# Patient Record
Sex: Male | Born: 1935 | Race: White | Hispanic: No | Marital: Married | Smoking: Former smoker
Health system: Southern US, Community
[De-identification: ages and names within clinical notes are randomized; demographics above are authoritative.]

## PROBLEM LIST (undated history)

## (undated) DIAGNOSIS — N329 Bladder disorder, unspecified: Secondary | ICD-10-CM

## (undated) DIAGNOSIS — E785 Hyperlipidemia, unspecified: Secondary | ICD-10-CM

## (undated) DIAGNOSIS — I10 Essential (primary) hypertension: Secondary | ICD-10-CM

## (undated) DIAGNOSIS — Z9181 History of falling: Secondary | ICD-10-CM

## (undated) DIAGNOSIS — Z8551 Personal history of malignant neoplasm of bladder: Secondary | ICD-10-CM

## (undated) DIAGNOSIS — G3183 Dementia with Lewy bodies: Secondary | ICD-10-CM

## (undated) DIAGNOSIS — G20 Parkinson's disease: Secondary | ICD-10-CM

## (undated) DIAGNOSIS — F0280 Dementia in other diseases classified elsewhere without behavioral disturbance: Secondary | ICD-10-CM

## (undated) DIAGNOSIS — E119 Type 2 diabetes mellitus without complications: Secondary | ICD-10-CM

## (undated) DIAGNOSIS — N39498 Other specified urinary incontinence: Secondary | ICD-10-CM

## (undated) DIAGNOSIS — M1990 Unspecified osteoarthritis, unspecified site: Secondary | ICD-10-CM

## (undated) DIAGNOSIS — G20C Parkinsonism, unspecified: Secondary | ICD-10-CM

## (undated) DIAGNOSIS — J00 Acute nasopharyngitis [common cold]: Secondary | ICD-10-CM

## (undated) DIAGNOSIS — Z993 Dependence on wheelchair: Secondary | ICD-10-CM

## (undated) DIAGNOSIS — L740 Miliaria rubra: Secondary | ICD-10-CM

## (undated) HISTORY — PX: CARDIOVASCULAR STRESS TEST: SHX262

## (undated) HISTORY — DX: Unspecified osteoarthritis, unspecified site: M19.90

## (undated) HISTORY — PX: TRANSURETHRAL RESECTION OF BLADDER TUMOR: SHX2575

---

## 2011-07-25 HISTORY — PX: CATARACT EXTRACTION W/ INTRAOCULAR LENS  IMPLANT, BILATERAL: SHX1307

## 2015-02-16 ENCOUNTER — Telehealth: Payer: Self-pay | Admitting: *Deleted

## 2015-02-16 ENCOUNTER — Encounter: Payer: Self-pay | Admitting: Neurology

## 2015-02-16 ENCOUNTER — Ambulatory Visit (INDEPENDENT_AMBULATORY_CARE_PROVIDER_SITE_OTHER): Payer: Medicare PPO | Admitting: Neurology

## 2015-02-16 VITALS — BP 124/73 | HR 71 | Temp 98.2°F | Ht 70.0 in | Wt 188.6 lb

## 2015-02-16 DIAGNOSIS — F0280 Dementia in other diseases classified elsewhere without behavioral disturbance: Secondary | ICD-10-CM | POA: Diagnosis not present

## 2015-02-16 DIAGNOSIS — J449 Chronic obstructive pulmonary disease, unspecified: Secondary | ICD-10-CM

## 2015-02-16 DIAGNOSIS — E785 Hyperlipidemia, unspecified: Secondary | ICD-10-CM | POA: Diagnosis not present

## 2015-02-16 DIAGNOSIS — G3183 Dementia with Lewy bodies: Secondary | ICD-10-CM | POA: Diagnosis not present

## 2015-02-16 DIAGNOSIS — E119 Type 2 diabetes mellitus without complications: Secondary | ICD-10-CM | POA: Diagnosis not present

## 2015-02-16 DIAGNOSIS — E038 Other specified hypothyroidism: Secondary | ICD-10-CM | POA: Diagnosis not present

## 2015-02-16 DIAGNOSIS — G20 Parkinson's disease: Secondary | ICD-10-CM

## 2015-02-16 MED ORDER — DONEPEZIL HCL 10 MG PO TABS: 10.0000 mg | ORAL_TABLET | Freq: Every day | ORAL | Status: DC

## 2015-02-16 MED ORDER — PRAMIPEXOLE DIHYDROCHLORIDE 0.75 MG PO TABS: 0.7500 mg | ORAL_TABLET | Freq: Three times a day (TID) | ORAL | Status: DC

## 2015-02-16 NOTE — Progress Notes (Addendum)
[facility]    Provider:  Dr Jaynee Eagles Referring Provider: Curlene Labrum, MD Primary Care Physician:  Curlene Labrum, MD  CC: Lewy Body Dementia  HPI:  Frederick Lawrence is a 81 y.o. male here as a referral from Dr. Pleas Koch for Lewy Body Dementia. PMHx diabetes and HLD, acute hypoxic respiratory failure, pneumonia, COPD, hypothroidism, DM.   He was diagnosed with Lewy Body dementia 2 years. They ignored the symptoms for a while. He began to stumble. They went to Dr. Linton Rump. He was told he has a neurologic disorder with some parkinsons like symptoms. He takes Mirapex and Carbidopa/levodopa from an outside physician. Doesn't feel like it has helped. They initially noticed he didn't know what the day was and didn't care. He knows his birthdate. He had  troubling dreams and hasn't happened for some time, .he has vivid dreams but doesn't act them out. His voice is getting softer. No drooling or wet pillows. He is incontinent. No constipation. His is slowing down, shuffling. Can't pick the feet up. No loss of smell or taste. Loves to eat. He noticed the motor skills more than the memory first. In the last 6 months, he doesn't want to communicate. He will answer questions but rarely speaks spontaneously. Handwriting is getting smaller. No difficukly swallowing currently. Not lightheaded. He does not feel the medication kicking in. He "runs down" in the afternoon before the last dose. She feels he is more stable now, not getting worse. He had pneumonia and getting better from that. H eis in physical therapy. Worse when eating breakfast. Right hand tremor mostly at rest and when eating. Having freezing episodes. Will take the medication at 8am, then at 1 and then at 8pm. He has been on the carbidopa and mirapex doses for at least 2 years without increase.   Reviewed notes, labs and imaging from outside physicians, which showed: recently moved from [facility] to [place] to be closer to family. Was seein  neurology for Lewy Body Dementia. He is on Carbidopa/Levodopa ER 50mg /200mg  ER tablet TID. He is on Aricept 5mg .   Review of Systems: Patient complains of symptoms per HPI as well as the following symptoms: easy bruising, hearing loss, urination problems, incontinence, memory loss, confusion, tremor, depression, too much sleep, disinterets in activities. . Pertinent negatives per HPI. All others negative.   History   Social History  . Marital Status: Married    Spouse Name: Francesca Jewett   . Number of Children: 3  . Years of Education: N/A   Occupational History  . Not on file.   Social History Main Topics  . Smoking status: Former Smoker -- 1.00 packs/day    Types: Cigarettes    Quit date: 01/10/2015  . Smokeless tobacco: Not on file  . Alcohol Use: 0.0 oz/week    0 Standard drinks or equivalent per week     Comment: Rarely   . Drug Use: No  . Sexual Activity: Not on file   Other Topics Concern  . Not on file   Social History Narrative   Lives at home with wife, Francesca Jewett   Caffeine use: 1 cup coffee/morning     Family History  Problem Relation Age of Onset  . Heart failure Mother   . Heart failure Father     Past Medical History  Diagnosis Date  . Diabetes   . Hyperlipemia     Past Surgical History  Procedure Laterality Date  . Bladder surgery  March 2016    Removal of  malignant neoplasm    Current Outpatient Prescriptions  Medication Sig Dispense Refill  . carbidopa-levodopa (SINEMET CR) 50-200 MG per tablet Take 1 tablet by mouth 3 (three) times daily.  0  . citalopram (CELEXA) 40 MG tablet Take 40 mg by mouth daily.  1  . donepezil (ARICEPT) 10 MG tablet Take 1 tablet (10 mg total) by mouth at bedtime. 30 tablet 6  . glipiZIDE (GLUCOTROL) 5 MG tablet Take 5 mg by mouth daily before breakfast.    . lisinopril (PRINIVIL,ZESTRIL) 20 MG tablet Take 20 mg by mouth daily.    . metFORMIN (GLUCOPHAGE) 1000 MG tablet Take 1,000 mg by mouth 2 (two) times daily with a  meal.    . pramipexole (MIRAPEX) 0.75 MG tablet Take 1 tablet (0.75 mg total) by mouth 3 (three) times daily. 90 tablet 6  . pravastatin (PRAVACHOL) 20 MG tablet Take 20 mg by mouth daily.    . tamsulosin (FLOMAX) 0.4 MG CAPS capsule Take 0.4 mg by mouth 2 (two) times daily.    . traZODone (DESYREL) 50 MG tablet Take 50 mg by mouth as needed.  2   No current facility-administered medications for this visit.    Allergies as of 02/16/2015  . (No Known Allergies)    Vitals: BP 124/73 mmHg  Pulse 71  Temp(Src) 98.2 F (36.8 C) (Oral)  Ht 5\' 10"  (1.778 m)  Wt 188 lb 9.6 oz (85.548 kg)  BMI 27.06 kg/m2 Last Weight:  Wt Readings from Last 1 Encounters:  02/16/15 188 lb 9.6 oz (85.548 kg)   Last Height:   Ht Readings from Last 1 Encounters:  02/16/15 5\' 10"  (1.778 m)    Physical exam: Exam: Gen: NAD, not conversant, well nourised, well groomed                     CV: RRR, no MRG. No Carotid Bruits. No peripheral edema, warm, nontender Eyes: Conjunctivae clear without exudates or hemorrhage  Neuro: Detailed Neurologic Exam  Speech:    Speech is without aphasia, dysarthria  Cognition:    The patient is oriented to person    recent and remote memory impaired;     language fluent;     Impaired attention, concentration,     fund of knowledge impaired Cranial Nerves:    The pupils are equal, round, and reactive to light. Attempted fundoscopic exam, unable to visualize.  Visual fields are full to finger confrontation. Extraocular movements are intact. Trigeminal sensation is intact and the muscles of mastication are normal. The face is symmetric. The palate elevates in the midline. Hearing intact. Voice is normal. Shoulder shrug is normal. The tongue has normal motion without fasciculations.   Coordination:    Bradykinetic finger taps   Gait: shuffling      Motor Observation:    Mil right resting tremor Tone:    Right cogwheeling > left arm cogwheeling   Posture:     Posture is normal. normal erect    Strength:    Strength is V/V in the upper and lower limbs.      Sensation: intact to LT     Reflex Exam:  DTR's:    Deep tendon reflexes in the upper and lower extremities are normal bilaterally.   Toes:    The toes are downgoing bilaterally.   Clonus:    Clonus is absent.      Assessment/Plan:   81 y.o. male here as a referral from Dr. Pleas Koch for Lewy Body  Dementia. PMHx diabetes and HLD, acute hypoxic respiratory failure, pneumonia, COPD, hypothroidism, DM. He was diagnosed years ago with lewy Body Dementia.   - Will request records - he is on Carbidopa/Levodopa 50/200 ER tid - unsure why he is on the ER instead of IR. Will hold for now but need records. - He is on Mirapex. Increase the Mirapex 0.75mg  three times a day Increase the Aricept to 10mg  daily  Discussed side effects including nausea, decreased inhibitions and increased risk for compulsive behavior, hypotention. Fall risk.  Will return in 3 months for follow up after I review records. Will likely make modifications to his medications.  Addendum: Notes from [facility].   MRI of the lumbar spine performed in February 2013: Shows mild to moderate lumbar spondylosis, borderline to mild central stenosis at L3-L4, multilevel facet arthropathy, small central disc protrusion at L1-L2.  EMG nerve conduction study in February 2013: Summary mild sensory greater than motor neuropathy. Needle EMG of the left lower extremity suggests chronic left L5 greater than S1 and L4 radiculopathy.  Labs include normal lead, normal methylmalonic acid, normal arsenic normal mercury normal cadmium, Z30 865, normal folic acid.  he was first evaluated in January 2013 for stooping over and losing balance was started in the summer of 2012. He described aching in the back, stooping, shuffling at the feet at time. Occasionally some trembling in the hands. Falls over forwards. Worsening. Gradual  onset and progressive. No incontinence. Some memory changes. Emotional instability and cries and laughs easily. Exam was significant for decreased sensation distally in the feet, slow shuffling gait which is slightly wide based. Diagnosed with diabetic neuropathy and symptoms of lumbar stenosis, symptoms of pseudobulbar affect, Lewy body disease. Had a long discussion with family in April 2013 with diagnosis of Lewy body disease. Aricept was started. Sinemet was started 25-100 by mouth twice a day. At a later appointment Sinemet was increased and low-dose Celexa was also added. At a later appointment Sinemet extended release 50-200 was substituted for Sinemet instant release, 1 by mouth twice a day. (Unclear why this was changed). Next appointment in October 2014 his Sinemet was changed to CR 50-200 milligram tablet one by mouth 3 times a day. In December 2014 Mirapex was added and subsequently increase at next appointments. Lenox Ponds was started in July 2015 while patient was still on Aricept 5 mg which doesn't appears that was ever increased to 10. Namenda was stopped because per notes neurologist did not feel as though it helped.   EEG in February 2013 was normal awake and drowsy.     Sarina Ill, MD  [facility] [address] [place], [place] [address]  Phone [phone] Fax [phone]

## 2015-02-16 NOTE — Patient Instructions (Addendum)
Overall you are doing fairly well but I do want to suggest a few things today:   Remember to drink plenty of fluid, eat healthy meals and do not skip any meals. Try to eat protein with a every meal and eat a healthy snack such as fruit or nuts in between meals. Try to keep a regular sleep-wake schedule and try to exercise daily, particularly in the form of walking, 20-30 minutes a day, if you can.   As far as your medications are concerned, I would like to suggest: Increase the Mirapex 0.75mg  three times a day Increase the Aricept to 10mg  daily  As far as diagnostic testing: Will review records  I would like to see you back in 3 months, sooner if we need to. Please call us with any interim questions, concerns, problems, updates or refill requests.   Please also call us for any test results so we can go over those with you on the phone.  My clinical assistant and will answer any of your questions and relay your messages to me and also relay most of my messages to you.   Our phone number is [phone]. We also have an after hours call service for urgent matters and there is a physician on-call for urgent questions. For any emergencies you know to call 911 or go to the nearest emergency room

## 2015-02-16 NOTE — Telephone Encounter (Signed)
Release faxed to Dr. Alain Marion requesting records.

## 2015-02-22 ENCOUNTER — Encounter: Payer: Self-pay | Admitting: Neurology

## 2015-02-22 DIAGNOSIS — E785 Hyperlipidemia, unspecified: Secondary | ICD-10-CM | POA: Insufficient documentation

## 2015-02-22 DIAGNOSIS — G3183 Dementia with Lewy bodies: Secondary | ICD-10-CM

## 2015-02-22 DIAGNOSIS — J449 Chronic obstructive pulmonary disease, unspecified: Secondary | ICD-10-CM | POA: Insufficient documentation

## 2015-02-22 DIAGNOSIS — G20 Parkinson's disease: Secondary | ICD-10-CM | POA: Insufficient documentation

## 2015-02-22 DIAGNOSIS — F0280 Dementia in other diseases classified elsewhere without behavioral disturbance: Secondary | ICD-10-CM | POA: Insufficient documentation

## 2015-02-22 DIAGNOSIS — E119 Type 2 diabetes mellitus without complications: Secondary | ICD-10-CM | POA: Insufficient documentation

## 2015-02-22 DIAGNOSIS — E039 Hypothyroidism, unspecified: Secondary | ICD-10-CM | POA: Insufficient documentation

## 2015-03-05 ENCOUNTER — Telehealth: Payer: Self-pay | Admitting: *Deleted

## 2015-03-05 NOTE — Telephone Encounter (Signed)
Received records from Dr Alain Marion went to [facility] and Dr Jaynee Eagles 03/05/15.

## 2015-03-11 ENCOUNTER — Telehealth: Payer: Self-pay | Admitting: Neurology

## 2015-03-11 ENCOUNTER — Other Ambulatory Visit: Payer: Self-pay | Admitting: Neurology

## 2015-03-11 DIAGNOSIS — G20 Parkinson's disease: Secondary | ICD-10-CM

## 2015-03-11 MED ORDER — PRAMIPEXOLE DIHYDROCHLORIDE 1 MG PO TABS: 1.0000 mg | ORAL_TABLET | Freq: Three times a day (TID) | ORAL | Status: DC

## 2015-03-11 NOTE — Telephone Encounter (Signed)
Yesterday he felt very bad. For 2-3 days he didn't feel like doing anything. He had yeast in his urine and was treated. That is when he went "down hill" and today he feels much better. He was running a slight temperature on Friday. Sounds like he had a medication reaction or some other metabolic or infectious process that caused his declne and not his parkinson's disease. He is going to pcp tomorrow for blood work.   Frederick Lawrence - can you call and make an appointment in the next 2 weeks for 30 minutes so we can discuss more medication management.   Frederick Lawrence - Patient would like MRI completed at [facility] please. I forgot to put that in the order. Sorry, thanks.

## 2015-03-11 NOTE — Telephone Encounter (Signed)
Pt's wife would like a call back regarding her husband, the pt. States that he has become weaker and lethargic. Has a hard time to get him to talk and answer anything. She has concern about medication carbidopa-levodopa (SINEMET CR) 50-200 MG per tablet. States that this medication has never been changed. Please call and advise [phone]

## 2015-03-11 NOTE — Telephone Encounter (Signed)
Frederick Lawrence witn [facility] called stating patient has declined, more difficulty with transition and initiating movement. Wife reports his carbidopa-levodopa (SINEMET CR) 50-200 MG per tablet has not been changed in about 3 years. She can be reached at [phone].

## 2015-03-15 ENCOUNTER — Telehealth: Payer: Self-pay | Admitting: Neurology

## 2015-03-15 MED ORDER — CARBIDOPA-LEVODOPA ER 50-200 MG PO TBCR: 1.0000 | EXTENDED_RELEASE_TABLET | Freq: Three times a day (TID) | ORAL | Status: DC

## 2015-03-15 NOTE — Telephone Encounter (Signed)
No problem! Rx has been sent to last until appt.  Receipt confirmed by pharmacy.  Frederick Lawrence is aware.

## 2015-03-15 NOTE — Telephone Encounter (Signed)
Called and spoke with pt/wife and scheduled f/u for 03/31/15 at 1:15pm for check-in at 1:00pm per Dr. Jaynee Eagles request. They verbalized understanding.

## 2015-03-15 NOTE — Telephone Encounter (Signed)
That was prescribed by another physician. I will approve refill it at this time but will likely make changes when he is here for his appointment. Woul dyou take care of it Jesscia please? Thank you!

## 2015-03-15 NOTE — Telephone Encounter (Signed)
I called back and spoke with Ms Dillion.  She verified patient is taking Carb/Levo ER 50-200mg  one three times daily.

## 2015-03-15 NOTE — Telephone Encounter (Signed)
Rosemary called requesting refill on carbidopa-levodopa (SINEMET CR) 50-200 MG per tablet, states he only has 1 left and won't have medication to take this evening. Pharmacy is [facility] in [place] on [address]. [phone].

## 2015-03-24 ENCOUNTER — Ambulatory Visit (HOSPITAL_COMMUNITY)
Admission: RE | Admit: 2015-03-24 | Discharge: 2015-03-24 | Disposition: A | Discharge status: Home / Self Care | Payer: Medicare PPO | Source: Ambulatory Visit | Attending: Neurology | Admitting: Neurology

## 2015-03-24 DIAGNOSIS — G319 Degenerative disease of nervous system, unspecified: Secondary | ICD-10-CM | POA: Diagnosis not present

## 2015-03-24 DIAGNOSIS — Z8551 Personal history of malignant neoplasm of bladder: Secondary | ICD-10-CM | POA: Diagnosis not present

## 2015-03-24 DIAGNOSIS — G20 Parkinson's disease: Secondary | ICD-10-CM | POA: Insufficient documentation

## 2015-03-24 DIAGNOSIS — R262 Difficulty in walking, not elsewhere classified: Secondary | ICD-10-CM | POA: Diagnosis not present

## 2015-03-29 ENCOUNTER — Telehealth: Payer: Self-pay | Admitting: Neurology

## 2015-03-29 NOTE — Telephone Encounter (Signed)
Spoke to wife regarding the MRI images. They are coming in This week for follow up and I can go through in detail with them again. He seems to be moving better on the increased dopa agonist.  Frederick Lawrence - they had an apointment at 1:15. I asked them to come to the office by 12:15 so I can start the appintment by 12:30 and have enough time.  So can you let the front desk know he will be coming early? Also, this way we can open up my 1:30 and 2pm for other new appointments. Thanks.   MRI brain: 1. Moderate atrophy and diffuse white matter disease bilaterally. This likely reflects the sequela of chronic microvascular ischemia.  2. Despite the atrophy common the ventricles are somewhat prominent. This raises possibility of normal pressure hydrocephalus is well. 3. Resolution at the cerebral peduncle nonspecific for Parkinson's disease.

## 2015-03-31 ENCOUNTER — Encounter: Payer: Self-pay | Admitting: Neurology

## 2015-03-31 ENCOUNTER — Ambulatory Visit (INDEPENDENT_AMBULATORY_CARE_PROVIDER_SITE_OTHER): Payer: Medicare PPO | Admitting: Neurology

## 2015-03-31 ENCOUNTER — Ambulatory Visit: Payer: Self-pay | Admitting: Neurology

## 2015-03-31 DIAGNOSIS — G20 Parkinson's disease: Secondary | ICD-10-CM | POA: Diagnosis not present

## 2015-03-31 DIAGNOSIS — G3183 Dementia with Lewy bodies: Secondary | ICD-10-CM | POA: Diagnosis not present

## 2015-03-31 DIAGNOSIS — F0280 Dementia in other diseases classified elsewhere without behavioral disturbance: Secondary | ICD-10-CM

## 2015-03-31 MED ORDER — CARBIDOPA-LEVODOPA 25-100 MG PO TABS: 2.0000 | ORAL_TABLET | Freq: Four times a day (QID) | ORAL | Status: DC

## 2015-03-31 MED ORDER — CARBIDOPA-LEVODOPA ER 50-200 MG PO TBCR: 1.0000 | EXTENDED_RELEASE_TABLET | Freq: Every day | ORAL | Status: DC

## 2015-03-31 NOTE — Progress Notes (Addendum)
[id number]    Provider: Dr Jaynee Eagles Referring Provider: Curlene Labrum, MD Primary Care Physician: Curlene Labrum, MD  CC: Lewy Body Dementia  Interval Update 03/31/2015: He returns today for follow up. Mirapex was increased to 1mg  tid and he is doing better but will hold at this dose due to age and potential side effects. He is a little wobblier since starting antibiotics. He is feeling a little dizzy today.  No side effects from the Aricept. No side effects from the Mirapex. He was having freezing episodes, he has a hard time getitng up out of the chair.   His walking is not good. His steps are very small. Slow. No drooling. His nose runs clear often. Voice volume is very low. No problems staying asleep or acting out dreams. Last time he fell was months ago. Just went backwards once. No behavioral issues, he is just quiet, no personality changes. Smell and taste is good. Difficulty getting out of a chair. Handwriting has gotten very small. No dyskinesias. Sometimes he "daydreams" but no hallucinations. He has had a few dreams that he couldn't wake up from and hge remembered them just like he watched a movie, just a few times. No REM sleep disorder.   MRi of the brain:  IMPRESSION:  1. Moderate atrophy and diffuse white matter disease bilaterally. This likely reflects the sequela of chronic microvascular ischemia.  2. Despite the atrophy common the ventricles are somewhat prominent. This raises possibility of normal pressure hydrocephalus is well.  3. Resolution at the cerebral peduncle nonspecific for Parkinson's disease.   Initial visit 02/16/2015: Frederick Lawrence is a 81 y.o. male here as a referral from Dr. Pleas Koch for Lewy Body Dementia. PMHx diabetes and HLD, acute hypoxic respiratory failure, pneumonia, COPD, hypothroidism, DM.   He was diagnosed with Lewy Body dementia and Parkinson's disorder 2 years. They ignored the symptoms for a while. He began to stumble.  They went to Dr. Linton Rump. He was told he has a neurologic disorder with some parkinsons like symptoms. He takes Mirapex and Carbidopa/levodopa from an outside physician. Doesn't feel like it has helped. They initially noticed he didn't know what the day was and didn't care. He knows his birthdate. He had troubling dreams and hasn't happened for some time, .he has vivid dreams but doesn't act them out. His voice is getting softer. No drooling or wet pillows. He is incontinent. No constipation. His is slowing down, shuffling. Can't pick the feet up. No loss of smell or taste. Loves to eat. He noticed the motor skills more than the memory first. In the last 6 months, he doesn't want to communicate. He will answer questions but rarely speaks spontaneously. Handwriting is getting smaller. No difficukly swallowing currently. Not lightheaded. He does not feel the medication kicking in. He "runs down" in the afternoon before the last dose. She feels he is more stable now, not getting worse. He had pneumonia and getting better from that. H eis in physical therapy. Worse when eating breakfast. Right hand tremor mostly at rest and when eating. Having freezing episodes. Will take the medication at 8am, then at 1 and then at 8pm. He has been on the carbidopa and mirapex doses for at least 2 years without increase.   Reviewed notes, labs and imaging from outside physicians, which showed: recently moved from [facility] to [place] to be closer to family. Was seein neurology for Lewy Body Dementia. He is on Carbidopa/Levodopa ER 50mg /200mg  ER tablet TID. He is on  Aricept 5mg .   Review of Systems: Patient complains of symptoms per HPI as well as the following symptoms: easy bruising, hearing loss, urination problems, incontinence, memory loss, confusion, tremor, depression, too much sleep, disinterets in activities. . Pertinent negatives per HPI. All others negative.  Social History   Social History  . Marital Status: Married     Spouse Name: Francesca Jewett   . Number of Children: 3  . Years of Education: N/A   Occupational History  . Not on file.   Social History Main Topics  . Smoking status: Former Smoker -- 1.00 packs/day    Types: Cigarettes    Quit date: 01/10/2015  . Smokeless tobacco: Not on file  . Alcohol Use: 0.0 oz/week    0 Standard drinks or equivalent per week     Comment: Rarely   . Drug Use: No  . Sexual Activity: Not on file   Other Topics Concern  . Not on file   Social History Narrative   Lives at home with wife, Francesca Jewett   Caffeine use: 1 cup coffee/morning     Family History  Problem Relation Age of Onset  . Heart failure Mother   . Heart failure Father   . Parkinsonism Neg Hx     Past Medical History  Diagnosis Date  . Diabetes   . Hyperlipemia     Past Surgical History  Procedure Laterality Date  . Bladder surgery  March 2016    Removal of malignant neoplasm    Current Outpatient Prescriptions  Medication Sig Dispense Refill  . aspirin 81 MG tablet Take 81 mg by mouth daily.    . carbidopa-levodopa (SINEMET CR) 50-200 MG per tablet Take 1 tablet by mouth 3 (three) times daily. 90 tablet 1  . citalopram (CELEXA) 40 MG tablet Take 40 mg by mouth daily.  1  . donepezil (ARICEPT) 10 MG tablet Take 1 tablet (10 mg total) by mouth at bedtime. 30 tablet 6  . lisinopril (PRINIVIL,ZESTRIL) 20 MG tablet Take 20 mg by mouth daily.    . metFORMIN (GLUCOPHAGE) 1000 MG tablet Take 1,000 mg by mouth 2 (two) times daily with a meal.    . nystatin cream (MYCOSTATIN) Apply 1 application topically as needed.  1  . pramipexole (MIRAPEX) 1 MG tablet Take 1 tablet (1 mg total) by mouth 3 (three) times daily. 90 tablet 6  . pravastatin (PRAVACHOL) 20 MG tablet Take 20 mg by mouth daily.    Marland Kitchen sulfamethoxazole-trimethoprim (BACTRIM DS,SEPTRA DS) 800-160 MG per tablet Take 1 tablet by mouth 2 (two) times daily.    . tamsulosin (FLOMAX) 0.4 MG CAPS capsule Take 0.4 mg by mouth 2 (two) times  daily.    . traZODone (DESYREL) 50 MG tablet Take 50 mg by mouth as needed.  2   No current facility-administered medications for this visit.    Allergies as of 03/31/2015  . (No Known Allergies)    Vitals: There were no vitals taken for this visit. Last Weight:  Wt Readings from Last 1 Encounters:  02/16/15 188 lb 9.6 oz (85.548 kg)   Last Height:   Ht Readings from Last 1 Encounters:  02/16/15 5\' 10"  (1.778 m)     Physical exam: Exam: Gen: NAD, not conversant, well nourised, well groomed  CV: RRR, no MRG. No Carotid Bruits. No peripheral edema, warm, nontender Eyes: Conjunctivae clear without exudates or hemorrhage  Neuro: Detailed Neurologic Exam  Speech:  Speech is without aphasia, dysarthria  Cognition: MoCA 8/30 patient  received 3 points for naming, 2 points for attention, 2 points for serial 7 subtraction and 1 point for orientation  The patient is oriented to person  recent and remote memory impaired;   language fluent;   impaired attention, concentration,   fund of knowledge impaired Cranial Nerves: Hypomimia.   The pupils are equal, round, and reactive to light.  Visual fields are full to threat. Extraocular movements are intact. Trigeminal sensation is intact and the muscles of mastication are normal. The face is symmetric. The palate elevates in the midline. Hearing intact. Voice is normal. Shoulder shrug is normal. The tongue has normal motion without fasciculations.   Coordination:  Bradykinetic finger taps   Gait: short strides, magnetic gait, enbloc turning, decreased arm swing    Motor Observation:  Mild right resting tremor  Tone:  Right cogwheeling > left arm cogwheeling   Posture:  Posture is slightly stooped   Strength: Mild hip flexion weaknes otherwise appears intact strength is V/V in the upper and lower limbs.    Sensation: intact to LT  Reflexes, attempted, paratonia  Toes  downgoing  +frontal release signs   Assessment/Plan: 81 y.o. male here as a referral from Dr. Pleas Koch for Parkinson's disease, Lewy Body Dementia. PMHx diabetes and HLD, acute hypoxic respiratory failure, pneumonia, COPD, hypothroidism, DM. He was diagnosed years ago with lewy Body Dementia and PD. MRI of the brain shows moderate atrophy and diffuse white matter disease bilaterally and enlarged ventricles. We'll refer to Dr. Rexene Alberts for second opinion on management of Parkinson's disease as well as her opinion on normal pressure hydrocephalus versus Parkinson's disorder.  - Will request records (see below) _ will hold Mirapex at current dose as is due to patient's age and potential side effects - he was on Carbidopa/Levodopa 50/200 ER tid prescribed previously - unsure why he is on the ER instead of IR. Will change to 25/100 IR and increase frequency to qid every 4 hours.-Try to separate Sinemet from food (especially protein-rich foods like meat, dairy, eggs) by about 30-60 mins - this will help the absorption of the medication. If you have some nausea with the medication, you can take it with some light food like crackers or ginger ale - Continue the Aricept to 10mg  daily - SInemet ER qhs  Discussed side effects including nausea, decreased inhibitions and increased risk for compulsive behavior, hypotention. Fall risk.   Addendum: Notes from [facility].   MRI of the lumbar spine performed in February 2013: Shows mild to moderate lumbar spondylosis, borderline to mild central stenosis at L3-L4, multilevel facet arthropathy, small central disc protrusion at L1-L2.  EMG nerve conduction study in February 2013: Summary mild sensory greater than motor neuropathy. Needle EMG of the left lower extremity suggests chronic left L5 greater than S1 and L4 radiculopathy.  Labs include normal lead, normal methylmalonic acid, normal arsenic normal mercury normal cadmium, V42 [id number], normal  folic acid.  he was first evaluated in January 2013 for stooping over and losing balance was started in the summer of 2012. He described aching in the back, stooping, shuffling at the feet at time. Occasionally some trembling in the hands. Falls over forwards. Worsening. Gradual onset and progressive. No incontinence. Some memory changes. Emotional instability and cries and laughs easily. Exam was significant for decreased sensation distally in the feet, slow shuffling gait which is slightly wide based. Diagnosed with diabetic neuropathy and symptoms of lumbar stenosis, symptoms of pseudobulbar affect, Lewy body disease. Had a long discussion  with family in April 2013 with diagnosis of Lewy body disease. Aricept was started. Sinemet was started 25-100 by mouth twice a day. At a later appointment Sinemet was increased and low-dose Celexa was also added. At a later appointment Sinemet extended release 50-200 was substituted for Sinemet instant release, 1 by mouth twice a day. (Unclear why this was changed). Next appointment in October 2014 his Sinemet was changed to CR 50-200 milligram tablet one by mouth 3 times a day. In December 2014 Mirapex was added and subsequently increase at next appointments. Lenox Ponds was started in July 2015 while patient was still on Aricept 5 mg which doesn't appears that was ever increased to 10. Namenda was stopped because per notes neurologist did not feel as though it helped.   EEG in February 2013 was normal awake and drowsy.  Sarina Ill, MD  [facility] [address] [place], [place] [address]  Phone [phone] Fax [phone]  A total of 45 minutes was spent face-to-face with this patient. Over half this time was spent on counseling patient on the PD diagnosis and different diagnostic and therapeutic options available.

## 2015-03-31 NOTE — Patient Instructions (Signed)
Overall you are doing fairly well but I do want to suggest a few things today:   Remember to drink plenty of fluid, eat healthy meals and do not skip any meals. Try to eat protein with a every meal and eat a healthy snack such as fruit or nuts in between meals. Try to keep a regular sleep-wake schedule and try to exercise daily, particularly in the form of walking, 20-30 minutes a day, if you can.   As far as your medications are concerned, I would like to suggest: Carbidopa/Levodopa 25-100 take 2 pills 4x a day as instructed.  Continue the Mirapex and Donepezil.   I would like to see you back first avail Dr. Rexene Alberts second opinion, sooner if we need to. Please call us with any interim questions, concerns, problems, updates or refill requests.   Please also call us for any test results so we can go over those with you on the phone.  My clinical assistant and will answer any of your questions and relay your messages to me and also relay most of my messages to you.   Our phone number is [phone]. We also have an after hours call service for urgent matters and there is a physician on-call for urgent questions. For any emergencies you know to call 911 or go to the nearest emergency room

## 2015-04-01 ENCOUNTER — Other Ambulatory Visit: Payer: Self-pay | Admitting: Neurology

## 2015-04-01 ENCOUNTER — Telehealth: Payer: Self-pay | Admitting: Neurology

## 2015-04-01 MED ORDER — CARBIDOPA-LEVODOPA 25-100 MG PO TABS: 2.0000 | ORAL_TABLET | Freq: Four times a day (QID) | ORAL | Status: DC

## 2015-04-01 MED ORDER — DONEPEZIL HCL 10 MG PO TABS: 10.0000 mg | ORAL_TABLET | Freq: Every day | ORAL | Status: DC

## 2015-04-01 MED ORDER — CARBIDOPA-LEVODOPA ER 50-200 MG PO TBCR: 1.0000 | EXTENDED_RELEASE_TABLET | Freq: Every day | ORAL | Status: DC

## 2015-04-01 MED ORDER — CITALOPRAM HYDROBROMIDE 40 MG PO TABS: 40.0000 mg | ORAL_TABLET | Freq: Every day | ORAL | Status: DC

## 2015-04-01 NOTE — Telephone Encounter (Signed)
Pt needs citalopram (CELEXA) 40 MG tablet. Please send to [facility] , as well as carbidopa-levodopa (SINEMET) 25-100 MG per tablet send to [facility]

## 2015-04-01 NOTE — Telephone Encounter (Signed)
Done, thanks

## 2015-04-04 NOTE — Addendum Note (Signed)
Addended by: Sarina Ill B on: 04/04/2015 08:50 PM   Modules accepted: Level of Service

## 2015-04-06 ENCOUNTER — Encounter: Payer: Self-pay | Admitting: *Deleted

## 2015-04-06 NOTE — Progress Notes (Addendum)
Faxed signed orders by Dr. Jaynee Eagles from [facility] for OT [phone] at [phone]. Received fax confirmation. Copy sent to medical records.  Phone#: [phone]

## 2015-04-09 ENCOUNTER — Other Ambulatory Visit: Payer: Self-pay

## 2015-04-09 MED ORDER — CARBIDOPA-LEVODOPA 25-100 MG PO TABS: 2.0000 | ORAL_TABLET | Freq: Four times a day (QID) | ORAL | Status: DC

## 2015-04-12 ENCOUNTER — Telehealth: Payer: Self-pay | Admitting: Neurology

## 2015-04-12 NOTE — Telephone Encounter (Signed)
Leeper back and advised Dr. Jaynee Eagles was not in office right now and I will send her a message and call back tomorrow. She verbalized understanding.

## 2015-04-12 NOTE — Telephone Encounter (Signed)
Fritz Pickerel with [facility] called stating he has 2 RX for carbidopa-levodopa (SINEMET) 25-100 MG per tablet with different directions written on the same date. The directions for one is take 2 tab 4 x day and the other is take 2 tabs 4 x day and 1/2 tab at bedtime. Can repeat additional 1/2 tab later in the evening as needed. Please call and advise which script is correct. He can be reached at [phone]

## 2015-04-14 ENCOUNTER — Other Ambulatory Visit: Payer: Self-pay | Admitting: Neurology

## 2015-04-14 NOTE — Telephone Encounter (Signed)
Spoke with patient's wife. He is doing well on the Mirapex 1mg   and the Sinemet 25-100 2 pills 4x a day. His mobility has increased and he is not having any side effects.   Terrence Dupont - he is on Sinemet 25-100 and he is taking 2pills 4x a day (not the extra 1/2, that is an error) Can you call and fix this for me? Thanks!!

## 2015-04-15 NOTE — Telephone Encounter (Signed)
Spoke w/ matt from [facility]. Called to verify that Dr. Jaynee Eagles wants him on Sinemet 25-100 2 pills 4x/day. Not the extra 1/2, that was an error. He is going to delete order for rx for 2 tablets, 4 times per day, and 1/2 tab in the evening if needed.   Already shipped one order. Shipped order yesterday in the mail for 25-100 2 tablets 4 times daily.  Gave Dr. Name, NPI number and office number to clarify.  Brought pharmacist on the line to verify information again. Spoke with Darlene and verified again that Dr. Jaynee Eagles wanted the Sinemet 25-100 rx for 2 tablets, 4 times per day. Not the rx for 2 tablets, 4 times per day, and 1/2 tab in the evening if needed. She Stated they shipped order for Rx Sinemet 25-100mg , 2 tablets, 4 times per day.

## 2015-04-15 NOTE — Telephone Encounter (Signed)
THANK YOU

## 2015-04-21 ENCOUNTER — Ambulatory Visit (INDEPENDENT_AMBULATORY_CARE_PROVIDER_SITE_OTHER): Payer: Medicare PPO | Admitting: Neurology

## 2015-04-21 ENCOUNTER — Encounter: Payer: Self-pay | Admitting: Neurology

## 2015-04-21 VITALS — BP 112/64 | HR 70 | Resp 16

## 2015-04-21 DIAGNOSIS — G9389 Other specified disorders of brain: Secondary | ICD-10-CM | POA: Diagnosis not present

## 2015-04-21 DIAGNOSIS — F0390 Unspecified dementia without behavioral disturbance: Secondary | ICD-10-CM | POA: Diagnosis not present

## 2015-04-21 DIAGNOSIS — G20C Parkinsonism, unspecified: Secondary | ICD-10-CM

## 2015-04-21 DIAGNOSIS — G20 Parkinson's disease: Secondary | ICD-10-CM | POA: Diagnosis not present

## 2015-04-21 DIAGNOSIS — R269 Unspecified abnormalities of gait and mobility: Secondary | ICD-10-CM | POA: Diagnosis not present

## 2015-04-21 DIAGNOSIS — R930 Abnormal findings on diagnostic imaging of skull and head, not elsewhere classified: Secondary | ICD-10-CM | POA: Diagnosis not present

## 2015-04-21 DIAGNOSIS — Z9181 History of falling: Secondary | ICD-10-CM | POA: Diagnosis not present

## 2015-04-21 DIAGNOSIS — R9082 White matter disease, unspecified: Secondary | ICD-10-CM

## 2015-04-21 NOTE — Patient Instructions (Signed)
You have a parkinson's like disorder, it may be difficult to tease out, whether there is some overlay with NPH (normal pressure hydrocephalus), you may have a condition called PSP (progressive supranuclear palsy). Using Sinemet is reasonable, I will ask Dr. Jaynee Eagles to taper down your Mirapex, due to swelling of your legs noted and more risk for side effects. I will ask her to consider a second memory medication. I doubt that you have LBD (lewy body dementia).  Supportive care is key. I would recommend therapy at home, [facility] PT/OT/ST. Follow up with Dr. Jaynee Eagles.

## 2015-04-21 NOTE — Progress Notes (Signed)
Subjective:    Patient ID: Frederick Lawrence is a 81 y.o. male.  HPI     Star Age, MD, PhD [facility] [address], [address] [place], [place] [address]  Dear Berta Minor,   I saw your patient, Frederick Lawrence, upon your kind request in my clinic today secondary opinion of his parkinsonism. The patient is accompanied by his wife today. As you know, Frederick Lawrence is a very pleasant 81 year old right-handed gentleman with an underlying medical history of COPD, hypothyroidism, diabetes, memory loss, hyperlipidemia, history of pneumonia, who started having abnormal gait and posture about 3-1/2 to 4 years ago. He was previously seen by a neurologist in [facility]. In April 2013 he was diagnosed with Lewy body dementia. He was started on Aricept and Sinemet. He has had memory loss for the past 2-3 years. His wife does not report any REM behavior disorder. He does not have any prominent hallucinations. His wife feels that he has had visual hallucinations less than a handful of times. Sometimes he has vivid dreams and seems to talk about his dreams a lot in the morning. He does not have recurrent falls but this is primarily because they have been very careful at home. He is currently in home health physical therapy, speech therapy and [facility]. He is very limited in his mobility with a walker. He does have a tendency to fall backwards. He has poor tracking with his eyes. He recently had a brain MRI without contrast on 03/24/2015 which I reviewed: 1. Moderate atrophy and diffuse white matter disease bilaterally. This likely reflects the sequela of chronic microvascular ischemia. 2. Despite the atrophy common the ventricles are somewhat prominent. This raises possibility of normal pressure hydrocephalus is well. 3. Resolution at the cerebral peduncle nonspecific for Parkinson's Disease. In addition, I personally reviewed the  images through the PACS system. He has significant atrophy and prominent white matter changes but also disproportionate lateral ventriculomegaly.   He has been on Celexa for mood disorder.  In the past, laboratory studies included heavy-metal screen which was unremarkable per your note, B12 level which was normal. 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 He had mild sensory greater than motor neuropathy. I reviewed your office note from 03/31/2015. His MOCA score was 8 out of 30 at the time. He was originally started on C/L ER 50-200 mg under Dr. Linton Rump in [place], [place] in 2012, without dramatic response at the time, per wife. Currently he is on Mirapex generic 1 mg 3 times a day, Aricept 10 mg once daily, Sinemet 25-100 milligrams strength 2 pills 4 times a day. She feels that the increase in Sinemet has helped some with his gait. He has been a smoker for years. He also has a bladder tumor that will need removal. He sees urology for this. He quit smoking in June 2016. He does not currently drink alcohol. He does not always drink enough water. His appetite is good. His history is almost exclusively provided by his wife. The patient is minimally verbal and answers in 1-2 word sentences only.  His Past Medical History Is Significant For: Past Medical History  Diagnosis Date  . Diabetes   . Hyperlipemia     His Past Surgical History Is Significant For: Past Surgical History  Procedure Laterality Date  . Bladder surgery  March 2016    Removal of malignant neoplasm    His Family History Is Significant For: Family History  Problem Relation Age of Onset  . Heart failure Mother   . Heart failure Father   . Parkinsonism Neg Hx     His Social History Is Significant For: Social History   Social History  . Marital Status: Married    Spouse Name: Francesca Jewett   . Number of Children:  3  . Years of Education: N/A   Social History Main Topics  . Smoking status: Former Smoker -- 1.00 packs/day    Types: Cigarettes    Quit date: 01/10/2015  . Smokeless tobacco: None  . Alcohol Use: 0.0 oz/week    0 Standard drinks or equivalent per week     Comment: Rarely   . Drug Use: No  . Sexual Activity: Not Asked   Other Topics Concern  . None   Social History Narrative   Lives at home with wife, Francesca Jewett   Caffeine use: 1 cup coffee/morning     His Allergies Are:  No Known Allergies:   His Current Medications Are:  Outpatient Encounter Prescriptions as of 04/21/2015  Medication Sig  . aspirin 81 MG tablet Take 81 mg by mouth daily.  . carbidopa-levodopa (SINEMET CR) 50-200 MG per tablet Take 1 tablet by mouth at bedtime.  . carbidopa-levodopa (SINEMET) 25-100 MG per tablet Take 2 tablets by mouth 4 (four) times daily.  . citalopram (CELEXA) 40 MG tablet Take 1 tablet (40 mg total) by mouth daily.  Marland Kitchen donepezil (ARICEPT) 10 MG tablet Take 1 tablet (10 mg total) by mouth at bedtime.  Marland Kitchen lisinopril (PRINIVIL,ZESTRIL) 20 MG tablet Take 20 mg by mouth daily.  . metFORMIN (GLUCOPHAGE) 1000 MG tablet Take 1,000 mg by mouth 2 (two) times daily with a meal.  . nystatin cream (MYCOSTATIN) Apply 1 application topically as needed.  . pramipexole (MIRAPEX) 1 MG tablet Take 1 tablet (1 mg total) by mouth 3 (three) times daily.  . pravastatin (PRAVACHOL) 20 MG tablet Take 20 mg by mouth daily.  . tamsulosin (FLOMAX) 0.4 MG CAPS capsule Take 0.4 mg by mouth 2 (two) times daily.  . traZODone (DESYREL) 50 MG tablet Take 50 mg by mouth as needed.  . [DISCONTINUED] sulfamethoxazole-trimethoprim (BACTRIM DS,SEPTRA DS) 800-160 MG per tablet Take 1 tablet by mouth 2 (two) times daily.   No facility-administered encounter medications on file as of 04/21/2015.  :   Review of Systems:  Out of a complete 14 point review of systems, all are reviewed and negative with the exception of these  symptoms as listed below:   Review of Systems  Neurological:       Patient is here for second opinion per Dr. Jaynee Eagles. Wife reports that patient has some "freezing" spells. States that recent changes in Carb/Levo have been good for patient. Wife reports that patient is hard to engage and and has trouble focusing. He currently has at home PT and ST.     Objective:  Neurologic Exam  Physical Exam Physical Examination:   Filed Vitals:   04/21/15 1550  BP: 112/64  Pulse: 70  Resp: 16    General Examination: The patient is a very pleasant 81 y.o. male in no acute distress. He is situated in his transport wheelchair. He is minimally verbal. He answers in 1-2 word sentences only. His history is almost exclusively provided by his wife. He looks to her for answers to questions posed to him.  HEENT: Normocephalic, atraumatic, pupils are equal, round and reactive to light and accommodation. Funduscopic exam is normal with sharp disc margins noted. Extraocular tracking shows very poor tracking. He has limitation to upgaze and downgaze. He has essentially no smooth pursuit. He has mild to moderate nuchal rigidity. He has a decreased eye blink rate. He has facial masking. He has no lip, neck or jaw tremor. He has mild mouth dryness on oropharynx exam. Tongue protrudes centrally and palate elevates symmetrically. He has no significant drooling.  Chest: is clear to auscultation without wheezing, rhonchi or crackles noted.  Heart: sounds are regular and normal without murmurs, rubs or gallops noted.   Abdomen: is soft, non-tender and non-distended with normal bowel sounds appreciated on auscultation.  Extremities: There is 1+ pitting edema in the distal lower extremities bilaterally, with mild erythema and chronic stasis-like changes noted.  Skin: is warm and dry with no trophic changes noted. Age-related changes are noted on the skin.   Musculoskeletal: exam reveals no obvious joint deformities,  tenderness, joint swelling or erythema.  Neurologically:  Mental status: The patient is awake and alert, paying fair  attention. He is unable to provide the history. His wife provides the entire history. His memory, attention, and knowledge are impaired. There is a moderate degree of bradyphrenia. Speech is mildly hypophonic with mild dysarthria noted. Affect appears blunted and constricted.  Cranial nerves are as described above under HEENT exam. In addition, shoulder shrug is normal with equal shoulder height noted.  Motor exam: Normal bulk, and global strength of 4-5 is noted. He has no resting tremor, postural or action tremor. Tone is mild to moderately rigid with some cogwheeling noted in both upper extremities, fine motor skills are moderate to severely impaired in the upper and lower extremities bilaterally, there is no significant lateralization. Reflexes are about 1+ throughout.  Romberg is not testable as he cannot stand unsupported. He stands from the seated position from his wheelchair with significant difficulty and has to push himself up but also requires assistance. He has a tendency to fall backwards. He does not push his legs through all the way. He stands with assistance on 2 sides. He still has a tendency to fall backwards. He took a few steps with difficulty. He does not pick up his feet very well. Arm swing is not testable as he has to hold on, with 2 person assist. He turns in multiple steps. Balance is significantly impaired.  Sensory exam is intact to light touch in the upper and lower extremities.     Assessment and Plan:   In summary, Frederick Lawrence is a very pleasant 81 y.o.-year old male with an underlying medical history of COPD, hypothyroidism, diabetes, memory loss, hyperlipidemia, history of pneumonia, who started having abnormal gait and posture about 3-1/2 to 4 years ago. He has had memory loss for about 3+ years. He has had lack of prominent hallucinations. He had a  brain MRI without contrast on  03/24/2015 which showed atrophy, significant white matter changes, and ventriculomegaly, raising concern for normal pressure hydrocephalus. His situation and clinical picture is complicated. I do believe he has some form of parkinsonism, but I don't think he has idiopathic Parkinson's disease. I doubt that he has Lewy body dementia given the lack of prominent hallucinations. The fact that he has gaze palsy, with poor tracking and a tendency to fall backwards, with bilateral parkinsonian features noted, raises the possibility for PSP, progressive supranuclear palsy, however, overlay with NPH will be  difficult to exclude. His wife is not keen on pursuing any invasive testing such as large volume spinal tap. I talked to her briefly about the possibility of doing a lumbar puncture and also eventually shunt placement. He has had some benefit from levodopa therapy. He has been on Mirapex as well. He has lower extremity swelling. She feels that this is relatively new. I would suggest the following today: I would like for you to consider to taper him down on Mirapex because of the lower extremity swelling and more risk for side effects with this dopamine agonist as opposed to levodopa therapy. I suggest to continue with symptomatic treatment with levodopa therapy but would shy away from escalating his dose to much. He is also on Aricept generic 10 mg once daily. His memory score has been in the single digit. I would like for you to consider adding a second memory medication such as Namenda long-acting. Supportive care is key. I talked to the patient and particularly his wife at length today. I suggested they continue to pursue home health therapies and he is currently in treatment at home with occupational, speech and physical therapy. He has an appointment with you in a month which I asked him to keep. I will see him back on an as-needed basis.  I answered all her questions today and the  patient's wife was in agreement.   Thank you very much for allowing me to participate in the care of this nice patient. If I can be of any further assistance to you please do not hesitate to talk to me.   Sincerely,   Star Age, MD, PhD

## 2015-05-05 ENCOUNTER — Encounter: Payer: Self-pay | Admitting: *Deleted

## 2015-05-05 NOTE — Progress Notes (Signed)
Faxed signed OT orders back to [facility] from Dr. Jaynee Eagles. Fax: [phone]. Received fax confirmation.

## 2015-05-19 ENCOUNTER — Telehealth: Payer: Self-pay | Admitting: Neurology

## 2015-05-19 ENCOUNTER — Ambulatory Visit (INDEPENDENT_AMBULATORY_CARE_PROVIDER_SITE_OTHER): Payer: Medicare PPO | Admitting: Neurology

## 2015-05-19 ENCOUNTER — Encounter: Payer: Self-pay | Admitting: Neurology

## 2015-05-19 VITALS — BP 139/77 | HR 66 | Temp 97.7°F | Ht 70.0 in

## 2015-05-19 DIAGNOSIS — G20 Parkinson's disease: Secondary | ICD-10-CM | POA: Diagnosis not present

## 2015-05-19 MED ORDER — MEMANTINE HCL 28 X 5 MG & 21 X 10 MG PO TABS: ORAL_TABLET | ORAL | Status: DC

## 2015-05-19 MED ORDER — MEMANTINE HCL-DONEPEZIL HCL ER 28-10 MG PO CP24: 1.0000 | ORAL_CAPSULE | Freq: Every day | ORAL | Status: DC

## 2015-05-19 NOTE — Progress Notes (Signed)
[facility]    Provider:  Dr Jaynee Eagles Referring Provider: Curlene Labrum, MD Primary Care Physician:  Curlene Labrum, MD  CC: Lewy Body Dementia  Interval update 05/19/2015: He was evaluated by Dr. Rexene Alberts as a second opinion. She believes he has a form of parkinsonism but not Parkinson's disease. She did not think he had Lewy Body dementia given the lack of hallucinations. She raised the possibility of PSP. She tapered down on the Mirapex due to LE edema. Will Add Namenda as a second agent. He is in treatment with home OT and PT, speech and physical therapy. The swelling in the legs improved. No nausea or side effects from the Mirapex or the Sinemet. He is doing better on the increased medication doses. He has been having PT and OT.  Balance is better, strength is getting better. The PT is getting him out walking and helpin him learn to get up off the floor. And the speech therapist has been helping getting him more engaged and talking more. No snoring at night. He is tired during the day. They have tried namenda in the past and was too expensive. Will start Namzeric.   Interval Update 03/31/2015: He returns today for follow up. Mirapex was increased to 1mg  tid and he is doing better but will hold at this dose due to age and potential side effects. He is a little wobblier since starting antibiotics. He is feeling a little dizzy today. No side effects from the Aricept. No side effects from the Mirapex. He was having freezing episodes, he has a hard time getitng up out of the chair. His walking is not good. His steps are very small. Slow. No drooling. His nose runs clear often. Voice volume is very low. No problems staying asleep or acting out dreams. Last time he fell was months ago. Just went backwards once. No behavioral issues, he is just quiet, no personality changes. Smell and taste is good. Difficulty getting out of a chair. Handwriting has gotten very small. No dyskinesias.  Sometimes he "daydreams" but no hallucinations. He has had a few dreams that he couldn't wake up from and hge remembered them just like he watched a movie, just a few times. No REM sleep disorder.   MRi of the brain:  IMPRESSION:  1. Moderate atrophy and diffuse white matter disease bilaterally. This likely reflects the sequela of chronic microvascular ischemia.  2. Despite the atrophy common the ventricles are somewhat prominent. This raises possibility of normal pressure hydrocephalus is well.  3. Resolution at the cerebral peduncle nonspecific for Parkinson's disease.   Initial visit 02/16/2015: Coleman Kalas is a 81 y.o. male here as a referral from Dr. Pleas Koch for Lewy Body Dementia. PMHx diabetes and HLD, acute hypoxic respiratory failure, pneumonia, COPD, hypothroidism, DM.   He was diagnosed with Lewy Body dementia and Parkinson's disorder 2 years. They ignored the symptoms for a while. He began to stumble. They went to Dr. Linton Rump. He was told he has a neurologic disorder with some parkinsons like symptoms. He takes Mirapex and Carbidopa/levodopa from an outside physician. Doesn't feel like it has helped. They initially noticed he didn't know what the day was and didn't care. He knows his birthdate. He had troubling dreams and hasn't happened for some time, .he has vivid dreams but doesn't act them out. His voice is getting softer. No drooling or wet pillows. He is incontinent. No constipation. His is slowing down, shuffling. Can't pick the feet up. No loss of  smell or taste. Loves to eat. He noticed the motor skills more than the memory first. In the last 6 months, he doesn't want to communicate. He will answer questions but rarely speaks spontaneously. Handwriting is getting smaller. No difficukly swallowing currently. Not lightheaded. He does not feel the medication kicking in. He "runs down" in the afternoon before the last dose. She feels he is more stable now, not getting worse. He had  pneumonia and getting better from that. H eis in physical therapy. Worse when eating breakfast. Right hand tremor mostly at rest and when eating. Having freezing episodes. Will take the medication at 8am, then at 1 and then at 8pm. He has been on the carbidopa and mirapex doses for at least 2 years without increase.   Reviewed notes, labs and imaging from outside physicians, which showed: recently moved from [facility] to [place] to be closer to family. Was seein neurology for Lewy Body Dementia. He is on Carbidopa/Levodopa ER 50mg /200mg  ER tablet TID. He is on Aricept 5mg .   Review of Systems: Patient complains of symptoms per HPI as well as the following symptoms: easy bruising, hearing loss, urination problems, incontinence, memory loss, confusion, tremor, depression, too much sleep, disinterets in activities. . Pertinent negatives per HPI. All others negative.  Social History   Social History  . Marital Status: Married    Spouse Name: Francesca Jewett   . Number of Children: 3  . Years of Education: N/A   Occupational History  . Not on file.   Social History Main Topics  . Smoking status: Former Smoker -- 1.00 packs/day    Types: Cigarettes    Quit date: 01/10/2015  . Smokeless tobacco: Not on file  . Alcohol Use: 0.0 oz/week    0 Standard drinks or equivalent per week     Comment: Rarely   . Drug Use: No  . Sexual Activity: Not on file   Other Topics Concern  . Not on file   Social History Narrative   Lives at home with wife, Francesca Jewett   Caffeine use: 1 cup coffee/morning     Family History  Problem Relation Age of Onset  . Heart failure Mother   . Heart failure Father   . Parkinsonism Neg Hx     Past Medical History  Diagnosis Date  . Diabetes ([place])   . Hyperlipemia     Past Surgical History  Procedure Laterality Date  . Bladder surgery  March 2016    Removal of malignant neoplasm    Current Outpatient Prescriptions  Medication Sig Dispense Refill  . aspirin 81 MG tablet  Take 81 mg by mouth daily.    . carbidopa-levodopa (SINEMET CR) 50-200 MG per tablet Take 1 tablet by mouth at bedtime. 30 tablet 11  . carbidopa-levodopa (SINEMET) 25-100 MG per tablet Take 2 tablets by mouth 4 (four) times daily. 720 tablet 1  . citalopram (CELEXA) 40 MG tablet Take 1 tablet (40 mg total) by mouth daily. 30 tablet 6  . donepezil (ARICEPT) 10 MG tablet Take 1 tablet (10 mg total) by mouth at bedtime. 30 tablet 6  . lisinopril (PRINIVIL,ZESTRIL) 20 MG tablet Take 20 mg by mouth daily.    . metFORMIN (GLUCOPHAGE) 1000 MG tablet Take 1,000 mg by mouth 2 (two) times daily with a meal.    . nystatin cream (MYCOSTATIN) Apply 1 application topically as needed.  1  . pramipexole (MIRAPEX) 1 MG tablet Take 1 tablet (1 mg total) by mouth 3 (three) times daily.  90 tablet 6  . pravastatin (PRAVACHOL) 20 MG tablet Take 20 mg by mouth daily.    . tamsulosin (FLOMAX) 0.4 MG CAPS capsule Take 0.4 mg by mouth 2 (two) times daily.    . traZODone (DESYREL) 50 MG tablet Take 50 mg by mouth as needed.  2   No current facility-administered medications for this visit.    Allergies as of 05/19/2015  . (No Known Allergies)    Vitals: BP 139/77 mmHg  Pulse 66  Temp(Src) 97.7 F (36.5 C) (Oral)  Ht 5\' 10"  (1.778 m) Last Weight:  Wt Readings from Last 1 Encounters:  02/16/15 188 lb 9.6 oz (85.548 kg)   Last Height:   Ht Readings from Last 1 Encounters:  05/19/15 5\' 10"  (1.778 m)   Neuro: Detailed Neurologic Exam  Speech:  Speech is without aphasia, dysarthria  Cognition:  Montreal Cognitive Assessment  03/31/2015  Visuospatial/ Executive (0/5) 0  Naming (0/3) 3  Attention: Read list of digits (0/2) 2  Attention: Read list of letters (0/1) 0  Attention: Serial 7 subtraction starting at 100 (0/3) 2  Language: Repeat phrase (0/2) 0  Language : Fluency (0/1) 0  Abstraction (0/2) 0  Delayed Recall (0/5) 0  Orientation (0/6) 1  Total 8     The patient is oriented to  person  recent and remote memory impaired;   language fluent;   impaired attention, concentration,   fund of knowledge impaired Cranial Nerves: Hypomimia.   The pupils are equal, round, and reactive to light. Visual fields are full to threat. Extraocular movements are intact. Trigeminal sensation is intact and the muscles of mastication are normal. The face is symmetric. The palate elevates in the midline. Hearing intact. Voice is normal. Shoulder shrug is normal. The tongue has normal motion without fasciculations.   Coordination:  Bradykinetic finger taps   Gait: short strides, magnetic gait, enbloc turning, decreased arm swing    Motor Observation:  Mild right resting tremor  Tone:  Right cogwheeling > left arm cogwheeling   Posture:  Posture is slightly stooped   Strength: Mild hip flexion weaknes otherwise appears intact strength is V/V in the upper and lower limbs.    Sensation: intact to LT  Reflexes, attempted, paratonia  Toes downgoing  +frontal release signs   Assessment/Plan: 80 y.o. male here as a referral from Dr. Pleas Koch for Parkinsonism. PMHx diabetes and HLD, acute hypoxic respiratory failure, pneumonia, COPD, hypothroidism, DM. He was diagnosed years ago with lewy Body Dementia and PD. MRI of the brain shows moderate atrophy and diffuse white matter disease bilaterally and enlarged ventricles.   - Will request records (see below) _ will hold Mirapex at current dose as is due to patient's age and potential side effects. He is doing well on current dose without side effects. Discussed side effects including nausea, decreased inhibitions and increased risk for compulsive behavior, hypotention, confusion. Fall risk.  - he was on Carbidopa/Levodopa 50/200 ER tid prescribed previously - unsure why he is on the ER instead of IR. Changed to 25/100 IR and increase frequency to qid every 4 hours.-Try to separate Sinemet from food  (especially protein-rich foods like meat, dairy, eggs) by about 30-60 mins - this will help the absorption of the medication. If you have some nausea with the medication, you can take it with some light food like crackers or ginger ale. He is doing well on this dosage without side effects. - Continue the Aricept to 10mg  daily, he was on  this previously - SInemet ER qhs - Start Namenda  Addendum: Notes from [facility].   MRI of the lumbar spine performed in February 2013: Shows mild to moderate lumbar spondylosis, borderline to mild central stenosis at L3-L4, multilevel facet arthropathy, small central disc protrusion at L1-L2.  EMG nerve conduction study in February 2013: Summary mild sensory greater than motor neuropathy. Needle EMG of the left lower extremity suggests chronic left L5 greater than S1 and L4 radiculopathy.  Labs include normal lead, normal methylmalonic acid, normal arsenic normal mercury normal cadmium, E94 076, normal folic acid.  he was first evaluated in January 2013 for stooping over and losing balance was started in the summer of 2012. He described aching in the back, stooping, shuffling at the feet at time. Occasionally some trembling in the hands. Falls over forwards. Worsening. Gradual onset and progressive. No incontinence. Some memory changes. Emotional instability and cries and laughs easily. Exam was significant for decreased sensation distally in the feet, slow shuffling gait which is slightly wide based. Diagnosed with diabetic neuropathy and symptoms of lumbar stenosis, symptoms of pseudobulbar affect, Lewy body disease. Had a long discussion with family in April 2013 with diagnosis of Lewy body disease. Aricept was started. Sinemet was started 25-100 by mouth twice a day. At a later appointment Sinemet was increased and low-dose Celexa was also added. At a later appointment Sinemet extended release 50-200 was substituted for Sinemet instant  release, 1 by mouth twice a day. (Unclear why this was changed). Next appointment in October 2014 his Sinemet was changed to CR 50-200 milligram tablet one by mouth 3 times a day. In December 2014 Mirapex was added and subsequently increase at next appointments. [place] was started in July 2015 while patient was still on Aricept 5 mg which doesn't appears that was ever increased to 10. Namenda was stopped because per notes neurologist did not feel as though it helped.   EEG in February 2013 was normal awake and drowsy.  Sarina Ill, MD  [facility] [address] [place], [place] [address]  Phone [phone] Fax [phone]  A total of 45 minutes was spent face-to-face with this patient. Over half this time was spent on counseling patient on the PD diagnosis and different diagnostic and therapeutic options available.

## 2015-05-19 NOTE — Telephone Encounter (Signed)
[employer] has approved the request for coverage on Namzaric Ref # [id number]

## 2015-05-19 NOTE — Telephone Encounter (Signed)
Frederick Lawrence - I started patient on the Namenda titration pack today. Will insurance pay for Namzaric? Any way you could find out? They will be on 28mg  of the namenda XR in about a month and then we can start the Namzaric. I have been told as long as they are on the [place] then insurance will pay. Woul dyou mind?

## 2015-05-19 NOTE — Patient Instructions (Addendum)
Overall you are doing well but I do want to suggest a few things today:   Remember to drink plenty of fluid, eat healthy meals and do not skip any meals. Try to eat protein with a every meal and eat a healthy snack such as fruit or nuts in between meals. Try to keep a regular sleep-wake schedule and try to exercise daily, particularly in the form of walking, 20-30 minutes a day, if you can.   As far as your medications are concerned, I would like to suggest Stop Aricept. Start the Namzaric titration pack then continue with the Namzaric medication  I would like to see you back in 4 - 6 months, sooner if we need to. Please call us with any interim questions, concerns, problems, updates or refill requests.   Our phone number is [phone]. We also have an after hours call service for urgent matters and there is a physician on-call for urgent questions. For any emergencies you know to call 911 or go to the nearest emergency room

## 2015-05-19 NOTE — Telephone Encounter (Signed)
I contacted ins regarding Namzaric.  They requested copies of clinical notes.  This info has been sent.  They are reviewing the request for coverage at this time. Ref # [id number]

## 2015-05-20 ENCOUNTER — Other Ambulatory Visit: Payer: Self-pay | Admitting: Neurology

## 2015-05-20 MED ORDER — PRAMIPEXOLE DIHYDROCHLORIDE 1 MG PO TABS: 1.0000 mg | ORAL_TABLET | Freq: Three times a day (TID) | ORAL | Status: DC

## 2015-05-20 MED ORDER — CARBIDOPA-LEVODOPA ER 50-200 MG PO TBCR: 1.0000 | EXTENDED_RELEASE_TABLET | Freq: Every day | ORAL | Status: DC

## 2015-05-20 MED ORDER — MEMANTINE HCL-DONEPEZIL HCL ER 28-10 MG PO CP24: 1.0000 | ORAL_CAPSULE | Freq: Every day | ORAL | Status: DC

## 2015-05-20 MED ORDER — CARBIDOPA-LEVODOPA 25-100 MG PO TABS: 2.0000 | ORAL_TABLET | Freq: Four times a day (QID) | ORAL | Status: DC

## 2015-05-20 NOTE — Telephone Encounter (Signed)
Thank you !!! Do you mind letting wife know please?

## 2015-05-20 NOTE — Telephone Encounter (Signed)
I called and spoke with Frederick Lawrence.  Relayed approval info.  She expressed understanding and appreciation.

## 2015-07-08 ENCOUNTER — Other Ambulatory Visit: Payer: Self-pay | Admitting: Urology

## 2015-07-28 ENCOUNTER — Encounter (HOSPITAL_BASED_OUTPATIENT_CLINIC_OR_DEPARTMENT_OTHER): Payer: Self-pay | Admitting: *Deleted

## 2015-07-28 NOTE — Progress Notes (Signed)
SPOKE W/ WIFE. NEED WIFE IN PRE-OP PT HAS DEMENTIA.  NPO AFTER MN WITH EXCEPTION CLEAR LIQUIDS UNTIL 0730 (NO CREAM/ MILK PRODUCTS).  ARRIVE AT 1215.  NEEDS ISTAT AND EKG.  WILL TAKE FLOMAX, CELEXA, AND SINEMET AM DOS W/ SIPS OF WATER.  PT USES WHEELCHAIR CAN STAND AND PIVOT TO STRETCHER WITH ASSIST.

## 2015-08-02 ENCOUNTER — Other Ambulatory Visit: Payer: Self-pay | Admitting: Urology

## 2015-08-02 ENCOUNTER — Ambulatory Visit (HOSPITAL_BASED_OUTPATIENT_CLINIC_OR_DEPARTMENT_OTHER): Admission: RE | Admit: 2015-08-02 | Payer: Medicare PPO | Source: Ambulatory Visit | Admitting: Urology

## 2015-08-02 HISTORY — DX: Miliaria rubra: L74.0

## 2015-08-02 HISTORY — DX: Bladder disorder, unspecified: N32.9

## 2015-08-02 HISTORY — DX: Parkinsonism, unspecified: G20.C

## 2015-08-02 HISTORY — DX: History of falling: Z91.81

## 2015-08-02 HISTORY — DX: Parkinson's disease: G20

## 2015-08-02 HISTORY — DX: Dementia in other diseases classified elsewhere, unspecified severity, without behavioral disturbance, psychotic disturbance, mood disturbance, and anxiety: F02.80

## 2015-08-02 HISTORY — DX: Acute nasopharyngitis (common cold): J00

## 2015-08-02 HISTORY — DX: Other specified urinary incontinence: N39.498

## 2015-08-02 HISTORY — DX: Dependence on wheelchair: Z99.3

## 2015-08-02 HISTORY — DX: Type 2 diabetes mellitus without complications: E11.9

## 2015-08-02 HISTORY — DX: Dementia with Lewy bodies: G31.83

## 2015-08-02 HISTORY — DX: Essential (primary) hypertension: I10

## 2015-08-02 HISTORY — DX: Personal history of malignant neoplasm of bladder: Z85.51

## 2015-08-02 HISTORY — DX: Hyperlipidemia, unspecified: E78.5

## 2015-08-02 SURGERY — CYSTOSCOPY, WITH BIOPSY
Anesthesia: General

## 2015-08-02 NOTE — Progress Notes (Signed)
THIS WAS ON Monday 08-01-2014 HAD TO RESCHEDULE UNABLE TO GET HERE.  SPOKE W/ WIFE, PT HAS DEMENTIA.  NPO AFTER MN WITH EXCEPTION CLEAR LIQUIDS UNTIL 0700 (NO CREAM/ MILK PRODUCTS).  ARRIVE AT 1130.  NEEDS ISTAT AND EKG.  WILL TAKE FLOMAX, CELEXA, AND SINEMET AM DOS W/ SIPS OF WATER. PT USES WHEELCHAIR, CAN STAND AND PIVOT TO STRETCHER WITH ASSIST.

## 2015-08-05 ENCOUNTER — Other Ambulatory Visit: Payer: Self-pay | Admitting: Urology

## 2015-08-06 ENCOUNTER — Encounter (HOSPITAL_BASED_OUTPATIENT_CLINIC_OR_DEPARTMENT_OTHER): Payer: Self-pay | Admitting: *Deleted

## 2015-08-06 ENCOUNTER — Ambulatory Visit (HOSPITAL_BASED_OUTPATIENT_CLINIC_OR_DEPARTMENT_OTHER): Payer: Medicare PPO | Admitting: Certified Registered"

## 2015-08-06 ENCOUNTER — Ambulatory Visit (HOSPITAL_BASED_OUTPATIENT_CLINIC_OR_DEPARTMENT_OTHER)
Admission: RE | Admit: 2015-08-06 | Discharge: 2015-08-07 | Disposition: A | Discharge status: Home / Self Care | Payer: Medicare PPO | Source: Ambulatory Visit | Attending: Urology | Admitting: Urology

## 2015-08-06 ENCOUNTER — Other Ambulatory Visit: Payer: Self-pay

## 2015-08-06 ENCOUNTER — Encounter (HOSPITAL_COMMUNITY)
Admission: RE | Disposition: A | Discharge status: Home / Self Care | Payer: Self-pay | Source: Ambulatory Visit | Attending: Urology

## 2015-08-06 DIAGNOSIS — F0280 Dementia in other diseases classified elsewhere without behavioral disturbance: Secondary | ICD-10-CM | POA: Diagnosis not present

## 2015-08-06 DIAGNOSIS — Z9181 History of falling: Secondary | ICD-10-CM | POA: Diagnosis not present

## 2015-08-06 DIAGNOSIS — I10 Essential (primary) hypertension: Secondary | ICD-10-CM | POA: Insufficient documentation

## 2015-08-06 DIAGNOSIS — Z8551 Personal history of malignant neoplasm of bladder: Secondary | ICD-10-CM | POA: Diagnosis not present

## 2015-08-06 DIAGNOSIS — E119 Type 2 diabetes mellitus without complications: Secondary | ICD-10-CM | POA: Insufficient documentation

## 2015-08-06 DIAGNOSIS — Z7982 Long term (current) use of aspirin: Secondary | ICD-10-CM | POA: Diagnosis not present

## 2015-08-06 DIAGNOSIS — E039 Hypothyroidism, unspecified: Secondary | ICD-10-CM | POA: Diagnosis not present

## 2015-08-06 DIAGNOSIS — Z79899 Other long term (current) drug therapy: Secondary | ICD-10-CM | POA: Diagnosis not present

## 2015-08-06 DIAGNOSIS — G20 Parkinson's disease: Secondary | ICD-10-CM | POA: Insufficient documentation

## 2015-08-06 DIAGNOSIS — E785 Hyperlipidemia, unspecified: Secondary | ICD-10-CM | POA: Diagnosis not present

## 2015-08-06 DIAGNOSIS — J449 Chronic obstructive pulmonary disease, unspecified: Secondary | ICD-10-CM | POA: Diagnosis not present

## 2015-08-06 DIAGNOSIS — R310 Gross hematuria: Secondary | ICD-10-CM | POA: Diagnosis not present

## 2015-08-06 DIAGNOSIS — Z87891 Personal history of nicotine dependence: Secondary | ICD-10-CM | POA: Diagnosis not present

## 2015-08-06 DIAGNOSIS — Z7984 Long term (current) use of oral hypoglycemic drugs: Secondary | ICD-10-CM | POA: Insufficient documentation

## 2015-08-06 DIAGNOSIS — R109 Unspecified abdominal pain: Secondary | ICD-10-CM | POA: Diagnosis not present

## 2015-08-06 DIAGNOSIS — N329 Bladder disorder, unspecified: Secondary | ICD-10-CM | POA: Diagnosis present

## 2015-08-06 DIAGNOSIS — N3289 Other specified disorders of bladder: Secondary | ICD-10-CM | POA: Insufficient documentation

## 2015-08-06 DIAGNOSIS — Z993 Dependence on wheelchair: Secondary | ICD-10-CM | POA: Diagnosis not present

## 2015-08-06 DIAGNOSIS — R32 Unspecified urinary incontinence: Secondary | ICD-10-CM | POA: Insufficient documentation

## 2015-08-06 DIAGNOSIS — D494 Neoplasm of unspecified behavior of bladder: Secondary | ICD-10-CM | POA: Diagnosis present

## 2015-08-06 HISTORY — PX: CYSTOSCOPY W/ RETROGRADES: SHX1426

## 2015-08-06 HISTORY — PX: CYSTOSCOPY WITH BIOPSY: SHX5122

## 2015-08-06 LAB — POCT I-STAT, CHEM 8
BUN: 18 mg/dL (ref 6–20)
CALCIUM ION: 1.23 mmol/L (ref 1.13–1.30)
Chloride: 101 mmol/L (ref 101–111)
Creatinine, Ser: 0.9 mg/dL (ref 0.61–1.24)
GLUCOSE: 112 mg/dL — AB (ref 65–99)
HCT: 48 % (ref 39.0–52.0)
HEMOGLOBIN: 16.3 g/dL (ref 13.0–17.0)
Potassium: 4.2 mmol/L (ref 3.5–5.1)
SODIUM: 142 mmol/L (ref 135–145)
TCO2: 27 mmol/L (ref 0–100)

## 2015-08-06 LAB — GLUCOSE, CAPILLARY: Glucose-Capillary: 126 mg/dL — ABNORMAL HIGH (ref 65–99)

## 2015-08-06 SURGERY — CYSTOSCOPY, WITH BIOPSY
Anesthesia: General

## 2015-08-06 MED ORDER — ONDANSETRON HCL 4 MG/2ML IJ SOLN
4.0000 mg | Freq: Once | INTRAMUSCULAR | Status: DC | PRN
  Filled 2015-08-06: qty 2

## 2015-08-06 MED ORDER — ASPIRIN 81 MG PO CHEW
81.0000 mg | CHEWABLE_TABLET | Freq: Every day | ORAL | Status: DC
  Administered 2015-08-06: 81 mg via ORAL
  Filled 2015-08-06 (×2): qty 1

## 2015-08-06 MED ORDER — HYDRALAZINE HCL 20 MG/ML IJ SOLN
INTRAMUSCULAR | Status: DC | PRN
  Administered 2015-08-06 (×2): 2 mg via INTRAVENOUS

## 2015-08-06 MED ORDER — HYDROCODONE-ACETAMINOPHEN 5-325 MG PO TABS: 1.0000 | ORAL_TABLET | Freq: Four times a day (QID) | ORAL | Status: DC | PRN

## 2015-08-06 MED ORDER — FENTANYL CITRATE (PF) 100 MCG/2ML IJ SOLN
INTRAMUSCULAR | Status: AC
  Filled 2015-08-06: qty 2

## 2015-08-06 MED ORDER — ONDANSETRON HCL 4 MG/2ML IJ SOLN
INTRAMUSCULAR | Status: DC | PRN
  Administered 2015-08-06: 4 mg via INTRAVENOUS

## 2015-08-06 MED ORDER — WHITE PETROLATUM GEL
Status: AC
  Filled 2015-08-06: qty 10

## 2015-08-06 MED ORDER — CEFAZOLIN SODIUM 1-5 GM-% IV SOLN
1.0000 g | INTRAVENOUS | Status: DC
  Filled 2015-08-06: qty 50

## 2015-08-06 MED ORDER — HYDROCODONE-ACETAMINOPHEN 5-325 MG PO TABS
1.0000 | ORAL_TABLET | ORAL | Status: DC | PRN
  Administered 2015-08-07: 1 via ORAL
  Filled 2015-08-06: qty 2
  Filled 2015-08-06: qty 1

## 2015-08-06 MED ORDER — CEFAZOLIN SODIUM-DEXTROSE 2-3 GM-% IV SOLR
2.0000 g | INTRAVENOUS | Status: AC
  Administered 2015-08-06: 2 g via INTRAVENOUS
  Filled 2015-08-06: qty 50

## 2015-08-06 MED ORDER — BELLADONNA ALKALOIDS-OPIUM 16.2-60 MG RE SUPP
1.0000 | Freq: Four times a day (QID) | RECTAL | Status: DC | PRN
  Filled 2015-08-06: qty 1

## 2015-08-06 MED ORDER — LIDOCAINE HCL (CARDIAC) 20 MG/ML IV SOLN
INTRAVENOUS | Status: AC
  Filled 2015-08-06: qty 5

## 2015-08-06 MED ORDER — FENTANYL CITRATE (PF) 100 MCG/2ML IJ SOLN
25.0000 ug | INTRAMUSCULAR | Status: DC | PRN
  Administered 2015-08-06: 25 ug via INTRAVENOUS
  Filled 2015-08-06: qty 1

## 2015-08-06 MED ORDER — PRAVASTATIN SODIUM 20 MG PO TABS
20.0000 mg | ORAL_TABLET | Freq: Every evening | ORAL | Status: DC
  Administered 2015-08-06: 20 mg via ORAL
  Filled 2015-08-06 (×2): qty 1

## 2015-08-06 MED ORDER — LACTATED RINGERS IV SOLN
INTRAVENOUS | Status: DC
  Administered 2015-08-06: 13:00:00 via INTRAVENOUS
  Filled 2015-08-06: qty 1000

## 2015-08-06 MED ORDER — CITALOPRAM HYDROBROMIDE 20 MG PO TABS
40.0000 mg | ORAL_TABLET | Freq: Every morning | ORAL | Status: DC
  Administered 2015-08-06 – 2015-08-07 (×2): 40 mg via ORAL
  Filled 2015-08-06 (×2): qty 2

## 2015-08-06 MED ORDER — FENTANYL CITRATE (PF) 100 MCG/2ML IJ SOLN
INTRAMUSCULAR | Status: DC | PRN
  Administered 2015-08-06: 25 ug via INTRAVENOUS
  Administered 2015-08-06: 50 ug via INTRAVENOUS
  Administered 2015-08-06 (×2): 25 ug via INTRAVENOUS

## 2015-08-06 MED ORDER — ZOLPIDEM TARTRATE 5 MG PO TABS
5.0000 mg | ORAL_TABLET | Freq: Every evening | ORAL | Status: DC | PRN
  Filled 2015-08-06: qty 1

## 2015-08-06 MED ORDER — CEFAZOLIN SODIUM-DEXTROSE 2-3 GM-% IV SOLR
INTRAVENOUS | Status: AC
  Filled 2015-08-06: qty 50

## 2015-08-06 MED ORDER — CARBIDOPA-LEVODOPA ER 50-200 MG PO TBCR
1.0000 | EXTENDED_RELEASE_TABLET | Freq: Every day | ORAL | Status: DC
  Administered 2015-08-06: 1 via ORAL
  Filled 2015-08-06 (×3): qty 1

## 2015-08-06 MED ORDER — ONDANSETRON HCL 4 MG/2ML IJ SOLN
INTRAMUSCULAR | Status: AC
  Filled 2015-08-06: qty 2

## 2015-08-06 MED ORDER — HYDROMORPHONE HCL 1 MG/ML IJ SOLN
0.5000 mg | INTRAMUSCULAR | Status: DC | PRN
  Administered 2015-08-06: 1 mg via INTRAVENOUS
  Filled 2015-08-06 (×2): qty 1

## 2015-08-06 MED ORDER — DIPHENHYDRAMINE HCL 12.5 MG/5ML PO ELIX
12.5000 mg | ORAL_SOLUTION | Freq: Four times a day (QID) | ORAL | Status: DC | PRN
  Filled 2015-08-06: qty 5

## 2015-08-06 MED ORDER — HYDRALAZINE HCL 20 MG/ML IJ SOLN
INTRAMUSCULAR | Status: AC
  Filled 2015-08-06: qty 1

## 2015-08-06 MED ORDER — ACETAMINOPHEN 325 MG PO TABS
650.0000 mg | ORAL_TABLET | ORAL | Status: DC | PRN
  Filled 2015-08-06: qty 2

## 2015-08-06 MED ORDER — DIPHENHYDRAMINE HCL 50 MG/ML IJ SOLN
12.5000 mg | Freq: Four times a day (QID) | INTRAMUSCULAR | Status: DC | PRN
  Filled 2015-08-06: qty 0.25

## 2015-08-06 MED ORDER — ONDANSETRON HCL 4 MG/2ML IJ SOLN
4.0000 mg | INTRAMUSCULAR | Status: DC | PRN
  Filled 2015-08-06: qty 2

## 2015-08-06 MED ORDER — TAMSULOSIN HCL 0.4 MG PO CAPS
0.4000 mg | ORAL_CAPSULE | Freq: Two times a day (BID) | ORAL | Status: DC
  Administered 2015-08-06 – 2015-08-07 (×2): 0.4 mg via ORAL
  Filled 2015-08-06 (×3): qty 1

## 2015-08-06 MED ORDER — SODIUM CHLORIDE 0.9 % IV SOLN
INTRAVENOUS | Status: DC
  Administered 2015-08-06: 16:00:00 via INTRAVENOUS
  Filled 2015-08-06: qty 1000

## 2015-08-06 MED ORDER — LIDOCAINE HCL (CARDIAC) 20 MG/ML IV SOLN
INTRAVENOUS | Status: DC | PRN
  Administered 2015-08-06: 60 mg via INTRAVENOUS

## 2015-08-06 MED ORDER — FENTANYL CITRATE (PF) 100 MCG/2ML IJ SOLN
25.0000 ug | INTRAMUSCULAR | Status: DC | PRN
  Filled 2015-08-06: qty 0.5

## 2015-08-06 MED ORDER — CITALOPRAM HYDROBROMIDE 20 MG PO TABS
40.0000 mg | ORAL_TABLET | Freq: Every morning | ORAL | Status: DC
  Filled 2015-08-06: qty 1

## 2015-08-06 MED ORDER — PROPOFOL 10 MG/ML IV BOLUS
INTRAVENOUS | Status: DC | PRN
  Administered 2015-08-06: 140 mg via INTRAVENOUS

## 2015-08-06 MED ORDER — MEMANTINE HCL-DONEPEZIL HCL ER 28-10 MG PO CP24: 1.0000 | ORAL_CAPSULE | Freq: Every evening | ORAL | Status: DC

## 2015-08-06 MED ORDER — PRAMIPEXOLE DIHYDROCHLORIDE 1 MG PO TABS
1.0000 mg | ORAL_TABLET | Freq: Three times a day (TID) | ORAL | Status: DC
  Administered 2015-08-06 – 2015-08-07 (×3): 1 mg via ORAL
  Filled 2015-08-06 (×6): qty 1

## 2015-08-06 MED ORDER — PROPOFOL 10 MG/ML IV BOLUS
INTRAVENOUS | Status: AC
  Filled 2015-08-06: qty 20

## 2015-08-06 MED ORDER — CARBIDOPA-LEVODOPA 25-100 MG PO TABS
2.0000 | ORAL_TABLET | Freq: Four times a day (QID) | ORAL | Status: DC
  Administered 2015-08-06 – 2015-08-07 (×4): 2 via ORAL
  Filled 2015-08-06 (×5): qty 2

## 2015-08-06 SURGICAL SUPPLY — 36 items
ADAPTER CATH WHT DISP STRL (CATHETERS) IMPLANT
BAG DRAIN URO-CYSTO SKYTR STRL (DRAIN) ×3 IMPLANT
BAG URO CATCHER STRL LF (MISCELLANEOUS) ×3 IMPLANT
BASKET ZERO TIP NITINOL 2.4FR (BASKET) IMPLANT
CANISTER SUCT LVC 12 LTR MEDI- (MISCELLANEOUS) IMPLANT
CATH FOLEY 3WAY 30CC 22FR (CATHETERS) ×3 IMPLANT
CATH HEMA 3WAY 30CC 22FR COUDE (CATHETERS) ×3 IMPLANT
CATH INTERMIT  6FR 70CM (CATHETERS) IMPLANT
CATH ROBINSON RED A/P 16FR (CATHETERS) IMPLANT
CLOTH BEACON ORANGE TIMEOUT ST (SAFETY) ×3 IMPLANT
ELECT REM PT RETURN 9FT ADLT (ELECTROSURGICAL) ×3
ELECTRODE REM PT RTRN 9FT ADLT (ELECTROSURGICAL) ×2 IMPLANT
GLOVE BIO SURGEON STRL SZ7.5 (GLOVE) ×3 IMPLANT
GLOVE BIO SURGEON STRL SZ8 (GLOVE) IMPLANT
GOWN STRL REUS W/ TWL LRG LVL3 (GOWN DISPOSABLE) ×4 IMPLANT
GOWN STRL REUS W/ TWL XL LVL3 (GOWN DISPOSABLE) ×2 IMPLANT
GOWN STRL REUS W/TWL LRG LVL3 (GOWN DISPOSABLE) ×2
GOWN STRL REUS W/TWL XL LVL3 (GOWN DISPOSABLE) ×1
GUIDEWIRE ANG ZIPWIRE 038X150 (WIRE) IMPLANT
GUIDEWIRE STR DUAL SENSOR (WIRE) ×3 IMPLANT
IV NS IRRIG 3000ML ARTHROMATIC (IV SOLUTION) ×3 IMPLANT
KIT ROOM TURNOVER WOR (KITS) ×3 IMPLANT
MANIFOLD NEPTUNE II (INSTRUMENTS) ×3 IMPLANT
NDL SAFETY ECLIPSE 18X1.5 (NEEDLE) ×2 IMPLANT
NEEDLE HYPO 18GX1.5 SHARP (NEEDLE) ×1
NEEDLE HYPO 22GX1.5 SAFETY (NEEDLE) IMPLANT
NEEDLE SPNL 22GX7 QUINCKE BK (NEEDLE) IMPLANT
NS IRRIG 500ML POUR BTL (IV SOLUTION) IMPLANT
PACK CYSTO (CUSTOM PROCEDURE TRAY) ×3 IMPLANT
SYR 20CC LL (SYRINGE) ×6 IMPLANT
SYR 30ML LL (SYRINGE) ×3 IMPLANT
SYR BULB IRRIGATION 50ML (SYRINGE) IMPLANT
SYRINGE IRR TOOMEY STRL 70CC (SYRINGE) ×3 IMPLANT
TUBE CONNECTING 12X1/4 (SUCTIONS) IMPLANT
WATER STERILE IRR 3000ML UROMA (IV SOLUTION) ×6 IMPLANT
WATER STERILE IRR 500ML POUR (IV SOLUTION) ×3 IMPLANT

## 2015-08-06 NOTE — Brief Op Note (Signed)
08/06/2015  1:57 PM  PATIENT:  Frederick Lawrence  82 y.o. male  PRE-OPERATIVE DIAGNOSIS:  BLADDER LESION  POST-OPERATIVE DIAGNOSIS:  BLADDER LESION  PROCEDURE:  Procedure(s) with comments: CYSTOSCOPY WITH BIOPSY AND FULGERATION (N/A) - 1 HR NEEDS GYRUS [phone] [id number] CYSTOSCOPY WITH RETROGRADE PYELOGRAM (Bilateral)  SURGEON:  Surgeon(s) and Role:    * Cleon Gustin, MD - Primary  PHYSICIAN ASSISTANT:   ASSISTANTS: none   ANESTHESIA:   general  EBL:     BLOOD ADMINISTERED:none  DRAINS: Urinary Catheter (Foley)   LOCAL MEDICATIONS USED:  NONE  SPECIMEN:  Source of Specimen:  bladder biopsy x 4  DISPOSITION OF SPECIMEN:  PATHOLOGY  COUNTS:  YES  TOURNIQUET:  * No tourniquets in log *  DICTATION: .Note written in EPIC  PLAN OF CARE: Admit for overnight observation  PATIENT DISPOSITION:  PACU - hemodynamically stable.   Delay start of Pharmacological VTE agent (>24hrs) due to surgical blood loss or risk of bleeding: not applicable

## 2015-08-06 NOTE — Anesthesia Preprocedure Evaluation (Signed)
Anesthesia Evaluation  Patient identified by MRN, date of birth, ID band Patient awake    Reviewed: Allergy & Precautions, H&P , Patient's Chart, lab work & pertinent test results, reviewed documented beta blocker date and time   Airway Mallampati: II  TM Distance: >3 FB Neck ROM: full    Dental no notable dental hx.    Pulmonary former smoker,    Pulmonary exam normal breath sounds clear to auscultation       Cardiovascular hypertension,  Rhythm:regular Rate:Normal     Neuro/Psych    GI/Hepatic   Endo/Other  diabetes  Renal/GU      Musculoskeletal   Abdominal   Peds  Hematology   Anesthesia Other Findings Hypertension    Hyperlipidemia     Type 2 diabetes mellitus (HCC)      Parkinsonism   Lewy body dementia Uses wheelchair Total urinary incontinence            Reproductive/Obstetrics                             Anesthesia Physical Anesthesia Plan  ASA: II  Anesthesia Plan:    Post-op Pain Management:    Induction: Intravenous  Airway Management Planned: LMA  Additional Equipment:   Intra-op Plan:   Post-operative Plan:   Informed Consent: I have reviewed the patients History and Physical, chart, labs and discussed the procedure including the risks, benefits and alternatives for the proposed anesthesia with the patient or authorized representative who has indicated his/her understanding and acceptance.   Dental Advisory Given and Dental advisory given  Plan Discussed with: CRNA and Surgeon  Anesthesia Plan Comments: (Discussed GA with LMA, possible sore throat, potential need to switch to ETT, N/V, pulmonary aspiration. Questions answered. )        Anesthesia Quick Evaluation

## 2015-08-06 NOTE — Anesthesia Postprocedure Evaluation (Signed)
Anesthesia Post Note  Patient: Frederick Lawrence  Procedure(s) Performed: Procedure(s) (LRB): CYSTOSCOPY WITH BIOPSY AND FULGERATION (N/A) CYSTOSCOPY WITH RETROGRADE PYELOGRAM (Bilateral)  Patient location during evaluation: PACU Anesthesia Type: General Level of consciousness: awake and alert Pain management: pain level controlled Vital Signs Assessment: post-procedure vital signs reviewed and stable Respiratory status: spontaneous breathing, nonlabored ventilation, respiratory function stable and patient connected to nasal cannula oxygen Cardiovascular status: blood pressure returned to baseline and stable Postop Assessment: no signs of nausea or vomiting Anesthetic complications: no    Last Vitals:  Filed Vitals:   08/06/15 1143 08/06/15 1407  BP: 139/90 157/93  Pulse: 61 62  Temp: 37.1 C 36.5 C  Resp: 16 16    Last Pain:  Filed Vitals:   08/06/15 1415  PainSc: Asleep                 Zenaida Deed

## 2015-08-06 NOTE — Transfer of Care (Signed)
Immediate Anesthesia Transfer of Care Note  Patient: Frederick Lawrence  Procedure(s) Performed: Procedure(s) (LRB): CYSTOSCOPY WITH BIOPSY AND FULGERATION (N/A) CYSTOSCOPY WITH RETROGRADE PYELOGRAM (Bilateral)  Patient Location: PACU  Anesthesia Type: General  Level of Consciousness: awake, oriented, sedated and patient cooperative  Airway & Oxygen Therapy: Patient Spontanous Breathing and Patient connected to face mask oxygen  Post-op Assessment: Report given to PACU RN and Post -op Vital signs reviewed and stable  Post vital signs: Reviewed and stable  Complications: No apparent anesthesia complications

## 2015-08-06 NOTE — H&P (Signed)
Urology Admission H&P  Chief Complaint: bladder mass  History of Present Illness: Ms Makris is a 82yo with a hx of gross hematuria and on hematuria workup was found to have a bladder dome mass and a 1.5cm right renal calculus. He denies any significant LUTS. He continues to have intermittent, sharp, nonradiating right abdominal pain  Past Medical History  Diagnosis Date  . Hypertension   . Hyperlipidemia   . Type 2 diabetes mellitus ([place])   . Lesion of bladder   . History of bladder cancer   . Parkinsonism ([place]) neurolotist-  dr Rexene Alberts ([facility])    unspecified type  . Lewy body dementia   . Head cold     no fever  . Uses wheelchair     w/ little assist can stand and pivot to transfer  . Total urinary incontinence     depends  . Heat rash   . At high risk for falls    Past Surgical History  Procedure Laterality Date  . Transurethral resection of bladder tumor  2012  &   03/ 2016  . Cardiovascular stress test  07-05-2015   (done at [facility] hospital)    per pt wife and PCP note -- normal w/ no ischemia lexiscan nuclear study  . Cataract extraction w/ intraocular lens  implant, bilateral  2013    Home Medications:  Prescriptions prior to admission  Medication Sig Dispense Refill Last Dose  . aspirin 81 MG tablet Take 81 mg by mouth daily.   Past Month at Unknown time  . carbidopa-levodopa (SINEMET CR) 50-200 MG tablet Take 1 tablet by mouth at bedtime. 90 tablet 3 08/05/2015  . carbidopa-levodopa (SINEMET) 25-100 MG tablet Take 2 tablets by mouth 4 (four) times daily. 720 tablet 3 08/06/2015 at 0800  . citalopram (CELEXA) 40 MG tablet Take 1 tablet (40 mg total) by mouth daily. (Patient taking differently: Take 40 mg by mouth every morning. ) 30 tablet 6 08/06/2015 at 0800  . Dextromethorphan-Guaifenesin (MUCINEX DM) 30-600 MG TB12 Take 1 tablet by mouth 2 (two) times daily.   08/05/2015 at Unknown time  . lisinopril (PRINIVIL,ZESTRIL) 20 MG tablet Take 20 mg by  mouth every morning.    08/05/2015 at Unknown time  . Memantine HCl-Donepezil HCl (NAMZARIC) 28-10 MG CP24 Take 1 tablet by mouth daily. (Patient taking differently: Take 1 tablet by mouth every evening. ) 90 capsule 3 08/05/2015 at Unknown time  . metFORMIN (GLUCOPHAGE) 1000 MG tablet Take 1,000 mg by mouth 2 (two) times daily with a meal.   08/05/2015 at Unknown time  . nystatin cream (MYCOSTATIN) Apply 1 application topically as needed.  1 08/06/2015 at Unknown time  . pramipexole (MIRAPEX) 1 MG tablet Take 1 tablet (1 mg total) by mouth 3 (three) times daily. 180 tablet 3 08/06/2015 at 0800  . pravastatin (PRAVACHOL) 20 MG tablet Take 20 mg by mouth every evening.    08/05/2015 at Unknown time  . tamsulosin (FLOMAX) 0.4 MG CAPS capsule Take 0.4 mg by mouth 2 (two) times daily.   08/05/2015 at Unknown time   Allergies: No Known Allergies  Family History  Problem Relation Age of Onset  . Heart failure Mother   . Heart failure Father   . Parkinsonism Neg Hx    Social History:  reports that he quit smoking about 6 months ago. His smoking use included Cigarettes. He has a 60 pack-year smoking history. He has never used smokeless tobacco. He reports that he does  not drink alcohol or use illicit drugs.  Review of Systems  Genitourinary: Positive for dysuria, urgency, frequency, hematuria and flank pain.  All other systems reviewed and are negative.   Physical Exam:  Vital signs in last 24 hours: Temp:  [98.7 F (37.1 C)] 98.7 F (37.1 C) (01/13 1143) Pulse Rate:  [61] 61 (01/13 1143) Resp:  [16] 16 (01/13 1143) BP: (139)/(90) 139/90 mmHg (01/13 1143) SpO2:  [95 %] 95 % (01/13 1143) Weight:  [87.544 kg (193 lb)] 87.544 kg (193 lb) (01/13 1143) Physical Exam  Constitutional: He is oriented to person, place, and time. He appears well-developed and well-nourished.  HENT:  Head: Normocephalic and atraumatic.  Eyes: EOM are normal. Pupils are equal, round, and reactive to light.  Neck: Normal  range of motion. No thyromegaly present.  Cardiovascular: Normal rate and regular rhythm.   Respiratory: Effort normal. No respiratory distress.  GI: Soft. He exhibits no distension. There is no tenderness.  Musculoskeletal: Normal range of motion.  Neurological: He is alert and oriented to person, place, and time.  Skin: Skin is warm and dry.  Psychiatric: He has a normal mood and affect. His behavior is normal. Judgment and thought content normal.    Laboratory Data:  No results found for this or any previous visit (from the past 24 hour(s)). No results found for this or any previous visit (from the past 240 hour(s)). Creatinine: No results for input(s): CREATININE in the last 168 hours. Baseline Creatinine: unknown  Impression/Assessment:  81yo with a bladder mass and right renal calculus  Plan:  The risks/benefits/alternatives to cystoscopy, bilateral retrogrades, bladder biopsy was explained to the patient and he understands and wishes to proceed with surgery  Hoorain Kozakiewicz L 08/06/2015, 12:56 PM

## 2015-08-06 NOTE — Transfer of Care (Signed)
Immediate Anesthesia Transfer of Care Note  Patient: Bryam Bandt  Procedure(s) Performed: Procedure(s) (LRB): CYSTOSCOPY WITH BIOPSY AND FULGERATION (N/A) CYSTOSCOPY WITH RETROGRADE PYELOGRAM (Bilateral)  Patient Location: PACU  Anesthesia Type: General  Level of Consciousness: awake, oriented, sedated and patient cooperative  Airway & Oxygen Therapy: Patient Spontanous Breathing and Patient connected to face mask oxygen  Post-op Assessment: Report given to PACU RN and Post -op Vital signs reviewed and stable  Post vital signs: Reviewed and stable  Complications: No apparent anesthesia complications

## 2015-08-06 NOTE — Anesthesia Procedure Notes (Signed)
Procedure Name: LMA Insertion Date/Time: 08/06/2015 1:18 PM Performed by: Denna Haggard D Pre-anesthesia Checklist: Patient identified, Emergency Drugs available, Suction available and Patient being monitored Patient Re-evaluated:Patient Re-evaluated prior to inductionOxygen Delivery Method: Circle System Utilized Preoxygenation: Pre-oxygenation with 100% oxygen Intubation Type: IV induction Ventilation: Mask ventilation without difficulty LMA: LMA inserted LMA Size: 5.0 Number of attempts: 1 Airway Equipment and Method: Bite block Placement Confirmation: positive ETCO2 Tube secured with: Tape Dental Injury: Teeth and Oropharynx as per pre-operative assessment

## 2015-08-07 DIAGNOSIS — N3289 Other specified disorders of bladder: Secondary | ICD-10-CM | POA: Diagnosis not present

## 2015-08-07 NOTE — Discharge Instructions (Signed)
Transurethral Resection of Bladder Tumor (TURBT) or Bladder Biopsy   Definition:  Transurethral Resection of the Bladder Tumor is a surgical procedure used to diagnose and remove tumors within the bladder. TURBT is the most common treatment for early stage bladder cancer.  General instructions:     Your recent bladder surgery requires very little post hospital care but some definite precautions.  Despite the fact that no skin incisions were used, the area around the bladder incisions are raw and covered with scabs to promote healing and prevent bleeding. Certain precautions are needed to insure that the scabs are not disturbed over the next 2-4 weeks while the healing proceeds.  Because the raw surface inside your bladder and the irritating effects of urine you may expect frequency of urination and/or urgency (a stronger desire to urinate) and perhaps even getting up at night more often. This will usually resolve or improve slowly over the healing period. You may see some blood in your urine over the first 6 weeks. Do not be alarmed, even if the urine was clear for a while. Get off your feet and drink lots of fluids until clearing occurs. If you start to pass clots or don't improve call us.  Diet:  You may return to your normal diet immediately. Because of the raw surface of your bladder, alcohol, spicy foods, foods high in acid and drinks with caffeine may cause irritation or frequency and should be used in moderation. To keep your urine flowing freely and avoid constipation, drink plenty of fluids during the day (8-10 glasses). Tip: Avoid cranberry juice because it is very acidic.  Activity:  Your physical activity doesn't need to be restricted. However, if you are very active, you may see some blood in the urine. We suggest that you reduce your activity under the circumstances until the bleeding has stopped.  Bowels:  It is important to keep your bowels regular during the postoperative  period. Straining with bowel movements can cause bleeding. A bowel movement every other day is reasonable. Use a mild laxative if needed, such as milk of magnesia 2-3 tablespoons, or 2 Dulcolax tablets. Call if you continue to have problems. If you had been taking narcotics for pain, before, during or after your surgery, you may be constipated. Take a laxative if necessary.    Medication:  You should resume your pre-surgery medications unless told not to. In addition you may be given an antibiotic to prevent or treat infection. Antibiotics are not always necessary. All medication should be taken as prescribed until the bottles are finished unless you are having an unusual reaction to one of the drugs.     Foley Catheter Care A soft, flexible tube (Foley catheter) may have been placed in your bladder to drain urine and fluid. Follow these instructions: Taking Care of the Catheter  Keep the area where the catheter leaves your body clean.   Attach the catheter to the leg so there is no tension on the catheter.   Keep the drainage bag below the level of the bladder, but keep it OFF the floor.   Do not take long soaking baths. Your caregiver will give instructions about showering.   Wash your hands before touching ANYTHING related to the catheter or bag.   Using mild soap and warm water on a washcloth:   Clean the area closest to the catheter insertion site using a circular motion around the catheter.   Clean the catheter itself by wiping AWAY from the insertion   site for several inches down the tube.   NEVER wipe upward as this could sweep bacteria up into the urethra (tube in your body that normally drains the bladder) and cause infection.   Place a small amount of sterile lubricant at the tip of the penis where the catheter is entering.  Taking Care of the Drainage Bags  Two drainage bags may be taken home: a large overnight drainage bag, and a smaller leg bag which fits underneath  clothing.   It is okay to wear the overnight bag at any time, but NEVER wear the smaller leg bag at night.   Keep the drainage bag well below the level of your bladder. This prevents backflow of urine into the bladder and allows the urine to drain freely.   Anchor the tubing to your leg to prevent pulling or tension on the catheter. Use tape or a leg strap provided by the hospital.   Empty the drainage bag when it is 1/2 to 3/4 full. Wash your hands before and after touching the bag.   Periodically check the tubing for kinks to make sure there is no pressure on the tubing which could restrict the flow of urine.  Changing the Drainage Bags  Cleanse both ends of the clean bag with alcohol before changing.   Pinch off the rubber catheter to avoid urine spillage during the disconnection.   Disconnect the dirty bag and connect the clean one.   Empty the dirty bag carefully to avoid a urine spill.   Attach the new bag to the leg with tape or a leg strap.  Cleaning the Drainage Bags  Whenever a drainage bag is disconnected, it must be cleaned quickly so it is ready for the next use.   Wash the bag in warm, soapy water.   Rinse the bag thoroughly with warm water.   Soak the bag for 30 minutes in a solution of white vinegar and water (1 cup vinegar to 1 quart warm water).   Rinse with warm water.  SEEK MEDICAL CARE IF:   You have chills or night sweats.   You are leaking around your catheter or have problems with your catheter. It is not uncommon to have sporadic leakage around your catheter as a result of bladder spasms. If the leakage stops, there is not much need for concern. If you are uncertain, call your caregiver.   You develop side effects that you think are coming from your medicines.  SEEK IMMEDIATE MEDICAL CARE IF:   You are suddenly unable to urinate. Check to see if there are any kinks in the drainage tubing that may cause this. If you cannot find any kinks, call your  caregiver immediately. This is an emergency.   You develop shortness of breath or chest pains.   Bleeding persists or clots develop in your urine.   You have a fever.   You develop pain in your back or over your lower belly (abdomen).   You develop pain or swelling in your legs.   Any problems you are having get worse rather than better.  MAKE SURE YOU:   Understand these instructions.   Will watch your condition.   Will get help right away if you are not doing well or get worse.   

## 2015-08-07 NOTE — Discharge Summary (Signed)
Date of admission: 08/06/2015  Date of discharge: 08/07/2015  Admission diagnosis: Gross hematuria  Discharge diagnosis: Same  Secondary diagnoses:  Patient Active Problem List   Diagnosis Date Noted  . Bladder tumor 08/06/2015  . PD (Parkinson's disease) ([place]) 02/22/2015  . Lewy body dementia 02/22/2015  . DM (diabetes mellitus) ([place]) 02/22/2015  . HLD (hyperlipidemia) 02/22/2015  . COPD (chronic obstructive pulmonary disease) ([place]) 02/22/2015  . Hypothyroidism 02/22/2015    History and Physical: For full details, please see admission history and physical. Briefly, Frederick Lawrence is a 82 y.o. year old patient with History of urinary incontinence and voiding symptoms.  He presented to urology for these symptoms and workup revealed bladder mucosal abnormalities.  This prompted a bladder biopsy.  Marland Kitchen   Hospital Course: Patient tolerated the procedure well.  Postoperatively, the patient developed significant prostatic bleeding requiring the patient be placed on continuous bladder irrigation and admitted for observation.  However, he continued to have severe gross hematuria prompting a 3-way Foley catheter be placed and the patient be placed on continuous bladder irrigation.  His hospital course was uncomplicated.  On POD#1  he had met discharge criteria: was eating a regular diet, was up and ambulating independently,  pain was well controlled With oral pain medication, and the urine was clear yellow.  The patient was sent home with a Foley catheter.  PE: Filed Vitals:   08/06/15 1624 08/06/15 2144 08/07/15 0148 08/07/15 0541  BP: 174/96 137/71 120/55 120/64  Pulse: 84 79 72 70  Temp: 97.6 F (36.4 C) 98.1 F (36.7 C) 97.7 F (36.5 C) 98.2 F (36.8 C)  TempSrc:  Oral Oral Oral  Resp: 18 20 20 20   Height:      Weight:      SpO2: 98% 94% 94% 94%    Intake/Output Summary (Last 24 hours) at 08/07/15 1237 Last data filed at 08/07/15 0600  Gross per 24 hour  Intake 5691.25 ml  Output    6100 ml  Net -408.75 ml   NAD Abdomen is soft Urine was straw colored off CBI  Laboratory values:   Recent Labs  08/06/15 1257  HGB 16.3  HCT 48.0    Recent Labs  08/06/15 1257  NA 142  K 4.2  CL 101  GLUCOSE 112*  BUN 18  CREATININE 0.90   No results for input(s): LABPT, INR in the last 72 hours. No results for input(s): LABURIN in the last 72 hours. No results found for this or any previous visit.  Disposition: Home  Discharge instruction: The patient was instructed to be ambulatory but told to refrain from heavy lifting, strenuous activity, or driving.   Discharge medications:   Medication List    TAKE these medications        albuterol (2.5 MG/3ML) 0.083% nebulizer solution  Commonly known as:  PROVENTIL  Take 2.5 mg by nebulization every 6 (six) hours as needed for wheezing or shortness of breath.     aspirin 81 MG tablet  Take 81 mg by mouth daily.     carbidopa-levodopa 50-200 MG tablet  Commonly known as:  SINEMET CR  Take 1 tablet by mouth at bedtime.     carbidopa-levodopa 25-100 MG tablet  Commonly known as:  SINEMET  Take 2 tablets by mouth 4 (four) times daily.     citalopram 40 MG tablet  Commonly known as:  CELEXA  Take 1 tablet (40 mg total) by mouth daily.     HYDROcodone-acetaminophen 5-325 MG tablet  Commonly known as:  NORCO  Take 1 tablet by mouth every 6 (six) hours as needed for moderate pain.     lisinopril 20 MG tablet  Commonly known as:  PRINIVIL,ZESTRIL  Take 20 mg by mouth every morning.     Memantine HCl-Donepezil HCl 28-10 MG Cp24  Commonly known as:  NAMZARIC  Take 1 tablet by mouth daily.     metFORMIN 1000 MG tablet  Commonly known as:  GLUCOPHAGE  Take 1,000 mg by mouth 2 (two) times daily with a meal.     MUCINEX DM 30-600 MG Tb12  Take 1 tablet by mouth 2 (two) times daily as needed (cough/cold symptoms).     nystatin cream  Commonly known as:  MYCOSTATIN  Apply 1 application topically 3 (three) times  daily as needed for dry skin (on genitals due to incontinence).     pramipexole 1 MG tablet  Commonly known as:  MIRAPEX  Take 1 tablet (1 mg total) by mouth 3 (three) times daily.     pravastatin 20 MG tablet  Commonly known as:  PRAVACHOL  Take 20 mg by mouth every evening.     tamsulosin 0.4 MG Caps capsule  Commonly known as:  FLOMAX  Take 0.4 mg by mouth 2 (two) times daily.        Followup:      Follow-up Information    Follow up with MCKENZIE, PATRICK L, MD. Call in 1 week.   Specialty:  Urology   Why:  foley removal   Contact information:   [place] [address] [phone]

## 2015-08-07 NOTE — Progress Notes (Signed)
Patient discharged home with wife, discharge instructions given and explained to patient's wife and she verbalized understanding. Patient in no pain/distress. No wound noted, accompanied home by wife. Discharge home with foley, F/U outpatient with urologist; foley home care explained and demonstrated to wife and she verbalized understand as well. Transported to the car by staff.

## 2015-08-10 ENCOUNTER — Encounter (HOSPITAL_BASED_OUTPATIENT_CLINIC_OR_DEPARTMENT_OTHER): Payer: Self-pay | Admitting: Urology

## 2015-08-10 NOTE — Op Note (Signed)
Preoperative diagnosis: lesion at dome of the bladder  Postoperative diagnosis: Same  Procedure: 1 cystoscopy 2. bilateral retrograde pyelography 3. Intraoperative fluoroscopy, under one hour, with interpretation 4. Bladder biopsy with fulgeration  Attending: Nicolette Bang  Anesthesia: General  Estimated blood loss: Minimal  Drains: 20 French foley  Specimens: Bladder biopsies x 3  Antibiotics: ancef  Findings: 4cm dome bladder lesions, sessile. OIl droplets at dome of the bladder. Ureteral orifices in normal anatomic location. No hydronephrosis or filling defects in either collecting system  Indications: Patient is a 82 year old male with a lesion at the dome of the bladder found on CT scan. After discussing treatment options, they decided proceed with bladder biopsy.  Procedure her in detail: The patient was brought to the operating room and a brief timeout was done to ensure correct patient, correct procedure, correct site. General anesthesia was administered patient was placed in dorsal lithotomy position. Their genitalia was then prepped and draped in usual sterile fashion. A rigid 72 French cystoscope was passed in the urethra and the bladder. Bladder was inspected and we noted a large sessile lesion at the dome of the bladder and numerous oil droplets at the dome of the bladder. the ureteral orifices were in the normal orthotopic locations. a 6 french ureteral catheter was then instilled into the left ureteral orifice. a gentle retrograde was obtained and findings noted above. We then turned our attention to the right side. a 6 french ureteral catheter was then instilled into the right ureteral orifice. a gentle retrograde was obtained and findings noted above. We then removed the cystoscope and placed a resectoscope into the bladder. We proceeded to obtain multiple biopsies from the dome of the bladder. Hemostasis was then obtained with a bugbee. the bladder was then  drained, a 20 French foley was placed and this concluded the procedure which was well tolerated by patient.  Complications: None  Condition: Stable, extubated, transferred to PACU  Plan: Patient will be discharged home and will followup in 5 days for voiding trial

## 2015-08-31 ENCOUNTER — Telehealth: Payer: Self-pay | Admitting: Neurology

## 2015-08-31 MED ORDER — CARBIDOPA-LEVODOPA ER 50-200 MG PO TBCR: 1.0000 | EXTENDED_RELEASE_TABLET | Freq: Every day | ORAL | Status: DC

## 2015-08-31 NOTE — Telephone Encounter (Signed)
Pt's wife called requested refill for carbidopa-levodopa (SINEMET CR) 50-200 MG tablet  30 day supply to [facility] in [place]

## 2015-09-01 ENCOUNTER — Ambulatory Visit (AMBULATORY_SURGERY_CENTER): Payer: Self-pay

## 2015-09-01 VITALS — Ht 70.0 in | Wt 195.0 lb

## 2015-09-01 DIAGNOSIS — Z860100 Personal history of colon polyps, unspecified: Secondary | ICD-10-CM

## 2015-09-01 MED ORDER — SUPREP BOWEL PREP KIT 17.5-3.13-1.6 GM/177ML PO SOLN: 1.0000 | Freq: Once | ORAL | Status: DC

## 2015-09-01 NOTE — Progress Notes (Signed)
No allergies to eggs or soy No diet/weight loss meds No home oxygen No past problems with anesthesia  Has internet; registered emmi

## 2015-09-03 ENCOUNTER — Ambulatory Visit (INDEPENDENT_AMBULATORY_CARE_PROVIDER_SITE_OTHER): Payer: Medicare PPO | Admitting: Gastroenterology

## 2015-09-03 ENCOUNTER — Encounter: Payer: Self-pay | Admitting: Gastroenterology

## 2015-09-03 VITALS — BP 138/78 | Ht 69.5 in | Wt 195.0 lb

## 2015-09-03 DIAGNOSIS — N321 Vesicointestinal fistula: Secondary | ICD-10-CM | POA: Diagnosis not present

## 2015-09-03 NOTE — Progress Notes (Addendum)
09/03/2015 Frederick Lawrence [id number] 08-Jul-1934   HISTORY OF PRESENT ILLNESS:  This is a 82 year old male who is new to our office and sent here by Dr. Barry Dienes to discuss possible colonoscopy for possible colovesical fistula.  Apparently this was seen on some type of imaging from the urology office. Last colonoscopy was 3 years ago per his wife's report.  She says that he's had some polyps on colonoscopies in the these have been repeated every 3 years. We do not have any records at this time.  Patient does not have any GI complaints.  No issues with recurrent UTI's.  Patient does not ambulate well and his wife is concerned about him prepping at home if he would require colonoscopy.   Past Medical History  Diagnosis Date  . Hypertension   . Hyperlipidemia   . Type 2 diabetes mellitus ([place])   . Lesion of bladder   . History of bladder cancer   . Parkinsonism ([place]) neurolotist-  dr Rexene Alberts ([facility])    unspecified type  . Lewy body dementia   . Head cold     no fever  . Uses wheelchair     w/ little assist can stand and pivot to transfer  . Total urinary incontinence     depends  . Heat rash   . At high risk for falls   . Arthritis    Past Surgical History  Procedure Laterality Date  . Transurethral resection of bladder tumor  2012  &   03/ 2016  . Cardiovascular stress test  07-05-2015   (done at [facility] hospital)    per pt wife and PCP note -- normal w/ no ischemia lexiscan nuclear study  . Cataract extraction w/ intraocular lens  implant, bilateral  2013  . Cystoscopy with biopsy N/A 08/06/2015    Procedure: CYSTOSCOPY WITH BIOPSY AND FULGERATION;  Surgeon: Cleon Gustin, MD;  Location: [facility];  Service: Urology;  Laterality: N/A;  1 HR NEEDS GYRUS [id number]  . Cystoscopy w/ retrogrades Bilateral 08/06/2015    Procedure: CYSTOSCOPY WITH RETROGRADE PYELOGRAM;  Surgeon: Cleon Gustin, MD;  Location: [facility];  Service: Urology;  Laterality: Bilateral;    reports that he quit smoking about 7 months ago. His smoking use included Cigarettes. He has a 60 pack-year smoking history. He has never used smokeless tobacco. He reports that he does not drink alcohol or use illicit drugs. family history includes Heart failure in his father and mother. There is no history of Parkinsonism or Colon cancer. No Known Allergies    Outpatient Encounter Prescriptions as of 09/03/2015  Medication Sig  . albuterol (PROVENTIL) (2.5 MG/3ML) 0.083% nebulizer solution Take 2.5 mg by nebulization every 6 (six) hours as needed for wheezing or shortness of breath.  Marland Kitchen aspirin 81 MG tablet Take 81 mg by mouth daily. Reported on 09/01/2015  . carbidopa-levodopa (SINEMET CR) 50-200 MG tablet Take 1 tablet by mouth at bedtime.  . carbidopa-levodopa (SINEMET) 25-100 MG tablet Take 2 tablets by mouth 4 (four) times daily.  . citalopram (CELEXA) 40 MG tablet Take 1 tablet (40 mg total) by mouth daily.  Marland Kitchen Dextromethorphan-Guaifenesin (MUCINEX DM) 30-600 MG TB12 Take 1 tablet by mouth 2 (two) times daily as needed (cough/cold symptoms).   Marland Kitchen lisinopril (PRINIVIL,ZESTRIL) 20 MG tablet Take 20 mg by mouth every morning.   . Memantine HCl-Donepezil HCl (NAMZARIC) 28-10 MG CP24 Take 1 tablet by mouth daily. (Patient taking differently:  Take 1 tablet by mouth every evening. )  . metFORMIN (GLUCOPHAGE) 1000 MG tablet Take 1,000 mg by mouth 2 (two) times daily with a meal.  . nystatin cream (MYCOSTATIN) Apply 1 application topically 3 (three) times daily as needed for dry skin (on genitals due to incontinence).   . pramipexole (MIRAPEX) 1 MG tablet Take 1 tablet (1 mg total) by mouth 3 (three) times daily.  . pravastatin (PRAVACHOL) 20 MG tablet Take 20 mg by mouth every evening.   . tamsulosin (FLOMAX) 0.4 MG CAPS capsule Take 0.4 mg by mouth 2 (two) times daily.  . [DISCONTINUED] SUPREP BOWEL PREP SOLN Take 1 kit by mouth once.    No facility-administered encounter medications on file as of 09/03/2015.     REVIEW OF SYSTEMS  : All other systems reviewed and negative except where noted in the History of Present Illness.   PHYSICAL EXAM: BP 138/78 mmHg  Ht 5' 9.5" (1.765 m)  Wt 195 lb (88.451 kg)  BMI 28.39 kg/m2 General: Well developed white male in no acute distress Head: Normocephalic and atraumatic Eyes:  Sclerae anicteric, conjunctiva pink. Ears: Normal auditory acuity. Lungs: Clear throughout to auscultation Heart: Regular rate and rhythm Abdomen: Soft, non-distended.  Normal bowel sounds.  Non-tender. Musculoskeletal: Symmetrical with no gross deformities  Skin: No lesions on visible extremities Extremities: No edema  Neurological: Alert oriented x 4, grossly non-focal Psychological:  Alert and cooperative. Normal mood and affect  ASSESSMENT AND PLAN: -82 year old male with possible colovesical fistula:  Patient sent here to discuss colonoscopy per Dr. Barry Dienes. Apparently this was seen on some type of imaging from the urology office. Last colonoscopy was 3 years ago. I discussed with Dr. Hilarie Fredrickson and we have decided to try to obtain records and imaging from [facility], [facility] surgery, and from his previous colonoscopies in [place]. Once we obtain all of his records we can discuss with Dr. Barry Dienes and try to make a decision regarding colonoscopy versus even possibly a barium enema. Patient does not ambulate well and his wife is concerned about him prepping at home if he would require colonoscopy. If that is the route we decided to go may need to consider inpatient prep and procedure.   CC:  Burdine, Virgina Evener, MD   Addendum: Reviewed and agree with initial management. Janett Billow, please notify me when you have received all outside and local records for review.  Once reviewed we can discuss with Barry Dienes about endoscopic versus radiographic assessment of possible colonic fistula Jerene Bears, MD   **Received records from Dr. Barry Dienes and also from patient's previous gastroenterology/colonoscopy. Colonoscopy was performed in September 2013 at which time he was found to have 3 polyps that were removed, 2 being tubular adenomas and one a hyperplastic polyp. No mention of diverticular disease, however. Once again, we are seeing this patient to help rule out the possibility of a colovesical fistula. He has a history of bladder cancer and on last cystoscopy by Dr. Noah Delaine it showed inflammation and oily discharge, but no stool sediment noted. Also patient without notable pneumaturia. In light of his age and multiple comorbidities including dementia and being primarily wheelchair bound, colonoscopy for polyp surveillance is not indicated. Certainly we think that the risk of him having a colon malignancy causing inflammatory change to the colon and the bladder are very low as well since this would have to be invasive and he had colonoscopy just 3.5 years ago. I discussed with Dr. Hilarie Fredrickson and we  think that we will proceed with a CT enterography to see if this can help [place] shed any light on and help [place] solve this issue without having to prep him for a colonoscopy or even barium enema.  I just discussed this with the patient's wife and she is in agreement and understands. We will contact her next week to schedule this study. He has an appointment to see Dr. Noah Delaine again on March 30.

## 2015-09-05 NOTE — Addendum Note (Signed)
Addended by: Jerene Bears on: 09/05/2015 08:48 PM   Modules accepted: Level of Service

## 2015-09-06 ENCOUNTER — Other Ambulatory Visit: Payer: Self-pay | Admitting: Urology

## 2015-09-10 ENCOUNTER — Encounter: Payer: Medicare PPO | Admitting: Gastroenterology

## 2015-09-15 ENCOUNTER — Other Ambulatory Visit: Payer: Self-pay | Admitting: Urology

## 2015-09-20 ENCOUNTER — Ambulatory Visit (INDEPENDENT_AMBULATORY_CARE_PROVIDER_SITE_OTHER): Payer: Medicare PPO | Admitting: Neurology

## 2015-09-20 ENCOUNTER — Encounter: Payer: Self-pay | Admitting: Neurology

## 2015-09-20 VITALS — BP 128/78 | HR 71 | Temp 98.1°F | Ht 69.5 in

## 2015-09-20 DIAGNOSIS — G20 Parkinson's disease: Secondary | ICD-10-CM

## 2015-09-20 NOTE — Patient Instructions (Addendum)
Remember to drink plenty of fluid, eat healthy meals and do not skip any meals. Try to eat protein with a every meal and eat a healthy snack such as fruit or nuts in between meals. Try to keep a regular sleep-wake schedule and try to exercise daily, particularly in the form of walking, 20-30 minutes a day, if you can.   As far as your medications are concerned, I would like to suggest: Continue current medications  As far as diagnostic testing: Please bring MRI of the brain. Physical therapy in the home. Referral to [facility]   I would like to see you back in after being seen at [facility] , sooner if we need to. Please call us with any interim questions, concerns, problems, updates or refill requests.   Our phone number is [phone]. We also have an after hours call service for urgent matters and there is a physician on-call for urgent questions. For any emergencies you know to call 911 or go to the nearest emergency room

## 2015-09-20 NOTE — Progress Notes (Addendum)
[facility]    Provider: Dr Jaynee Eagles Referring Provider: Curlene Labrum, MD Primary Care Physician: Curlene Labrum, MD  CC: Parkinsonism  HPI: Tobi Conto is a 82 y.o. male here as a referral from Dr. Pleas Koch for Lewy Body Dementia. PMHx diabetes and HLD, acute hypoxic respiratory failure, pneumonia, COPD, hypothroidism, DM.   He was diagnosed with Lewy Body dementia and Parkinson's disorder 4 years ago. The symptoms started about 5 years ago. He began to stumble. They went to Dr. Linton Rump. He was told he has a neurologic disorder with some parkinsons like symptoms. He was previously seen by a neurologist in [facility] In April 2013 he was diagnosed with Lewy body dementia. He was started on Aricept and Sinemet as well as Mirapex. His wife does not report any REM behavior disorder. He does not have any prominent hallucinations. His wife feels that he has had visual hallucinations less than a handful of times. Sometimes he has vivid dreams and seems to talk about his dreams a lot in the morning. He does not have recurrent falls but this is primarily because they have been very careful at home. He has a tendency to fall backwards. He takes Mirapex and Carbidopa/levodopa. Doesn't feel like it has helped. He has had memory loss for the past 2-3 years.  They initially noticed he didn't know what the day was and didn't care. He knows his birthdate.  He noticed the motor symptoms before he noticed the memory changes.  His voice is getting softer. No drooling or wet pillows. He is incontinent. No constipation. His is slowing down, shuffling. Can't pick the feet up. No loss of smell or taste. Loves to eat. In the last 6 months, he doesn't want to communicate. He will answer questions but rarely speaks spontaneously. Handwriting is getting smaller. No difficukly swallowing currently. Not lightheaded. He does not feel the medication kicking in. He "runs down" in the  afternoon before the last dose. He feels he is more stable now, not getting worse.  Right hand tremor mostly at rest and when eating. Having freezing episodes. Will take the medication at 8am, then at 1 and then at 8pm. He has been on the carbidopa and mirapex for at least 2 years.    He has been on Celexa for mood disorder.  In the past, laboratory studies included heavy-metal screen which was unremarkable, B12 level which was normal. 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 He had mild sensory greater than motor neuropathy.  Interval update 09/20/2015: He was doing better with physical therapy. He has declined as far as mobility. There is no walking at all. He can't get up on his own. Has to help with transfers. And before he was transferring better. No falls. Patient says he feels fine. No pain. No side effects from the current medications. Discussed getting a second opinion from a movement disorder specialty clinic. Wife would like a second opinion on parkinsonism and possible causes such as PSP. Will request physical therapy in home with [facility].   MRi of the brain:  IMPRESSION:  1. Moderate atrophy and diffuse white matter disease bilaterally. This likely reflects the sequela of chronic microvascular ischemia.  2. Despite the atrophy common the ventricles are somewhat prominent. This raises possibility of normal pressure hydrocephalus is well.  3. Resolution at the cerebral peduncle nonspecific for Parkinson's disease.  Review of Systems: Patient complains of symptoms per HPI as well as the following symptoms: easy bruising, hearing loss, urination problems, incontinence, memory loss, confusion, tremor, depression, too much sleep, disinterets in activities. . Pertinent negatives per HPI. All others negative.   Social History   Social History  . Marital  Status: Married    Spouse Name: Francesca Jewett   . Number of Children: 3  . Years of Education: N/A   Occupational History  . Not on file.   Social History Main Topics  . Smoking status: Former Smoker -- 1.00 packs/day for 60 years    Types: Cigarettes    Quit date: 01/10/2015  . Smokeless tobacco: Never Used     Comment: quit [id number]  . Alcohol Use: No  . Drug Use: No  . Sexual Activity: Not on file   Other Topics Concern  . Not on file   Social History Narrative   Lives at home with wife, Francesca Jewett   Caffeine use: 1 cup coffee/morning     Family History  Problem Relation Age of Onset  . Heart failure Mother   . Heart failure Father   . Parkinsonism Neg Hx   . Colon cancer Neg Hx     Past Medical History  Diagnosis Date  . Hypertension   . Hyperlipidemia   . Type 2 diabetes mellitus ([place])   . Lesion of bladder   . History of bladder cancer   . Parkinsonism ([place]) neurolotist-  dr Rexene Alberts ([facility])    unspecified type  . Lewy body dementia   . Head cold     no fever  . Uses wheelchair     w/ little assist can stand and pivot to transfer  . Total urinary incontinence     depends  . Heat rash   . At high risk for falls   . Arthritis     Past Surgical History  Procedure Laterality Date  . Transurethral resection of bladder tumor  2012  &   03/ 2016  . Cardiovascular stress test  07-05-2015   (done at [facility])    per pt wife and PCP note -- normal w/ no ischemia lexiscan nuclear study  . Cataract extraction w/ intraocular lens  implant, bilateral  2013  . Cystoscopy with biopsy N/A 08/06/2015    Procedure: CYSTOSCOPY WITH BIOPSY AND FULGERATION;  Surgeon: Cleon Gustin, MD;  Location: [facility];  Service: Urology;  Laterality: N/A;  1 HR NEEDS GYRUS [phone] [id number]  . Cystoscopy w/ retrogrades Bilateral 08/06/2015    Procedure: CYSTOSCOPY WITH RETROGRADE PYELOGRAM;  Surgeon: Cleon Gustin, MD;   Location: [facility];  Service: Urology;  Laterality: Bilateral;    Current Outpatient Prescriptions  Medication Sig Dispense Refill  . ACCU-CHEK AVIVA PLUS test strip     . ACCU-CHEK SOFTCLIX LANCETS lancets     . albuterol (PROVENTIL) (2.5 MG/3ML) 0.083% nebulizer solution Take 2.5 mg by nebulization every 6 (six) hours as needed for wheezing or shortness of breath.    Marland Kitchen aspirin 81 MG tablet Take 81 mg by mouth daily. Reported on 09/01/2015    . carbidopa-levodopa (SINEMET CR) 50-200 MG tablet Take 1 tablet by mouth at bedtime. 30 tablet 1  . carbidopa-levodopa (SINEMET) 25-100 MG tablet Take 2 tablets by mouth 4 (four) times daily. 720 tablet 3  . Cholecalciferol (VITAMIN D PO) Take 1 Dose by mouth daily. Gummy    . citalopram (CELEXA) 40 MG tablet Take  1 tablet (40 mg total) by mouth daily. 30 tablet 6  . Dextromethorphan-Guaifenesin (MUCINEX DM) 30-600 MG TB12 Take 1 tablet by mouth 2 (two) times daily as needed (cough/cold symptoms).     Marland Kitchen glipiZIDE (GLUCOTROL XL) 5 MG 24 hr tablet Take 5 mg by mouth daily.    Marland Kitchen lisinopril (PRINIVIL,ZESTRIL) 20 MG tablet Take 20 mg by mouth every morning.     . Memantine HCl-Donepezil HCl (NAMZARIC) 28-10 MG CP24 Take 1 tablet by mouth daily. (Patient taking differently: Take 1 tablet by mouth every evening. ) 90 capsule 3  . metFORMIN (GLUCOPHAGE) 1000 MG tablet Take 1,000 mg by mouth 2 (two) times daily with a meal.    . Multiple Vitamins-Minerals (MULTIVITAMIN ADULT PO) Take 1 tablet by mouth daily.    Marland Kitchen nystatin cream (MYCOSTATIN) Apply 1 application topically 3 (three) times daily as needed for dry skin (on genitals due to incontinence).   1  . pramipexole (MIRAPEX) 1 MG tablet Take 1 tablet (1 mg total) by mouth 3 (three) times daily. 180 tablet 3  . pravastatin (PRAVACHOL) 20 MG tablet Take 20 mg by mouth every evening.     . tamsulosin (FLOMAX) 0.4 MG CAPS capsule Take 0.4 mg by mouth 2 (two) times daily.     No current  facility-administered medications for this visit.    Allergies as of 09/20/2015  . (No Known Allergies)    Vitals: BP 128/78 mmHg  Pulse 71  Temp(Src) 98.1 F (36.7 C) (Oral)  Ht 5' 9.5" (1.765 m) Last Weight:  Wt Readings from Last 1 Encounters:  09/03/15 195 lb (88.451 kg)   Last Height:   Ht Readings from Last 1 Encounters:  09/20/15 5' 9.5" (1.765 m)   Montreal Cognitive Assessment  03/31/2015  Visuospatial/ Executive (0/5) 0  Naming (0/3) 3  Attention: Read list of digits (0/2) 2  Attention: Read list of letters (0/1) 0  Attention: Serial 7 subtraction starting at 100 (0/3) 2  Language: Repeat phrase (0/2) 0  Language : Fluency (0/1) 0  Abstraction (0/2) 0  Delayed Recall (0/5) 0  Orientation (0/6) 1  Total 8    Speech: Hypophonic. Bradyphrenia.  Cranial Nerves: Hypomimia.   The pupils are equal, round, and reactive to light. Visual fields are full to threat. Significantly impaired vertical eye movements. Poor tracking. Nuchal rigidity. Decreased blink rate with facial masking. Trigeminal sensation is intact and the muscles of mastication are normal. The face is symmetric. The palate elevates in the midline. Hearing intact. Voice is normal. Shoulder shrug is normal. The tongue has normal motion without fasciculations.   Coordination:  Bradykinetic finger taps, severely impaired fine motor skills  Gait: Difficult for him to stand unsupported, limited exam but appears to have short strides, magnetic gait, enbloc turning, decreased arm swing. Tendency to fall backwards.    Motor Observation:  Mild right resting tremor  Tone: upper extremity rigidity with right cogwheeling > left arm cogwheeling   Posture:  Posture is slightly stooped   Strength: Mild hip flexion weaknes otherwise appears intact strength is V/V in the upper and lower limbs.    Sensation: intact to LT  Reflexes, attempted, paratonia  Toes downgoing  +frontal release  signs     Assessment/Plan: In summary, Frederick Lawrence is a very pleasant 82 y.o.-year old male with an underlying medical history of COPD, hypothyroidism, diabetes, memory loss, hyperlipidemia, history of pneumonia, who started having abnormal gait and posture about 3-1/2 to 4 years ago. He has  had memory loss for about 3+ years. He has had lack of prominent hallucinations. He had a brain MRI without contrast on 03/24/2015 which showed atrophy, significant white matter changes, and ventriculomegaly, raising concern for normal pressure hydrocephalus. His situation and clinical picture is complicated. I do believe he has some form of parkinsonism, but I don't think he has idiopathic Parkinson's disease. I doubt that he has Lewy body dementia given the lack of prominent hallucinations. The fact that he has gaze palsy, with poor tracking and a tendency to fall backwards, with bilateral parkinsonian features noted, raises the possibility for PSP, progressive supranuclear palsy, however, overlay with NPH will be difficult to exclude. His wife is not keen on pursuing any invasive testing such as large volume spinal tap. I talked to her briefly about the possibility of doing a lumbar puncture and also eventually shunt placement. He has had some benefit from levodopa therapy  - Will request records (see below) _ will hold Mirapex at current dose as is due to patient's age and potential side effects. He is doing well on current dose without side effects. Discussed side effects including nausea, decreased inhibitions and increased risk for compulsive behavior, hypotention, confusion. Fall risk.  - he was on Carbidopa/Levodopa 50/200 ER tid prescribed previously - unsure why he is on the ER instead of IR. Changed to 25/100 IR and increase frequency to qid every 4 hours.-Try to separate Sinemet from food (especially protein-rich foods like meat, dairy, eggs) by about 30-60 mins - this will help the absorption of  the medication. If you have some nausea with the medication, you can take it with some light food like crackers or ginger ale. He is doing well on this dosage without side effects. - Continue the Aricept to 10mg  daily, he was on this previously - SInemet ER qhs - Continue Namenda,  - Will order in-home physical therapy due to his decline in mobility, difficulty with transfers and ADLs - Wife would like to go to [facility] to specialty neurology clinic. Dr. Wells Guiles Tat would also be a favorable resource in this case.  Addendum: Notes from [facility].   MRI of the lumbar spine performed in February 2013: Shows mild to moderate lumbar spondylosis, borderline to mild central stenosis at L3-L4, multilevel facet arthropathy, small central disc protrusion at L1-L2.  EMG nerve conduction study in February 2013: Summary mild sensory greater than motor neuropathy. Needle EMG of the left lower extremity suggests chronic left L5 greater than S1 and L4 radiculopathy.  Labs include normal lead, normal methylmalonic acid, normal arsenic normal mercury normal cadmium, [id number], normal folic acid.  he was first evaluated in January 2013 for stooping over and losing balance was started in the summer of 2012. He described aching in the back, stooping, shuffling at the feet at time. Occasionally some trembling in the hands. Falls over forwards. Worsening. Gradual onset and progressive. No incontinence. Some memory changes. Emotional instability and cries and laughs easily. Exam was significant for decreased sensation distally in the feet, slow shuffling gait which is slightly wide based. Diagnosed with diabetic neuropathy and symptoms of lumbar stenosis, symptoms of pseudobulbar affect, Lewy body disease. Had a long discussion with family in April 2013 with diagnosis of Lewy body disease. Aricept was started. Sinemet was started 25-100 by mouth twice a day. At a later appointment Sinemet was  increased and low-dose Celexa was also added. At a later appointment Sinemet extended release 50-200 was substituted for Sinemet instant  release, 1 by mouth twice a day. (Unclear why this was changed). Next appointment in October 2014 his Sinemet was changed to CR 50-200 milligram tablet one by mouth 3 times a day. In December 2014 Mirapex was added and subsequently increase at next appointments. Len[place] P[place] was started in July 2015 while patient was still on Aricept 5 mg which doesn't appears that was ever increased to 10. Namenda was stopped because per notes neurologist did not feel as though it helped.   EEG in February 2013 was normal awake and drowsy.  Patient was evaluated at [facility] by Dr. Linus Mako: Examination shows severe apraxia, magnetic grasp reflex, thought it was 1981, president was [employer], could not do months backwards, could not hold a conversation for long, he was in an on medication state, eye movements were full, no nystagmus, limitation in upgaze, had saccadic intrusions, no limitations in down gaze, no facial weakness, stiffness, no gross motor deficit, monotone, moderately impaired speech, abnormal diminution of the facial expression, slight bilateral action/postural tremor bilaterally, marketed rigidity of the neck but full range of motion, rigidity right upper extremity marketed by full range of motion, rigidity left upper extremity severe range of motion achieved with difficulty, rigidity of the bilateral lower extremities were marketed but full range of motion easily achieved. Finger taps right hand severely impaired with frequent hesitation initiating movements are rests and ongoing movement, finger taps on the left were similar, moderately impaired grip in the right hand and severely impaired on the left with hesitation initiating movements, unable to arise without help, severely stooped posture with kyphosis, cannot walk at all even with assistance, unable to stand without  assistance, body bradykinesia and hypokinesia with Parkinson Lois, poverty or sharp amplitude movement. Total score was 63  Impression: He does not have NPH or PSP, he has atypical parkinsonism and given his cognitive symptoms starting around the same time as his parkinsonism, severe dementia now within 5 years of symptom onset, some level of fluctuations in cognition, this is more consistent with diffusely body dementia rather than Parkinson's disease. One compounding factor is his extensive encephalomalacia on brain MRI and that can represent a superimposed vascular dementia and vascular parkinsonism. He is not getting a robust improvement in his motor symptoms with levodopa and Mirapex. Given his severe dementia, recommendations would be to gradually taper off his Mirapex completely over the next 1-1-1/2 months. If there is clear motor decline, Sinemet can be increased to compensate hopefully this will improve his confusion. He also recommended that given his severe dementia and lack of mobility that he may be a candidate for hospice.  Sarina Ill, MD  [facility] [address] [place],  [address]  Phone [phone] Fax [phone]  A total of 40 minutes was spent face-to-face with this patient. Over half this time was spent on counseling patient on the Parlrkinsonism diagnosis and different diagnostic and therapeutic options available.

## 2015-09-22 ENCOUNTER — Telehealth: Payer: Self-pay | Admitting: Neurology

## 2015-09-22 DIAGNOSIS — G20C Parkinsonism, unspecified: Secondary | ICD-10-CM | POA: Insufficient documentation

## 2015-09-22 DIAGNOSIS — G20 Parkinson's disease: Secondary | ICD-10-CM | POA: Insufficient documentation

## 2015-09-22 NOTE — Telephone Encounter (Signed)
Frederick Lawrence, would you please place an in-home physical therapy referral to [facility] for in-home PT. Thanks  Hinton Dyer - would you send a referral for evaluation to [facility] preferably Dr. Linus Mako for parkinsonism. Please include my last office visit dated Monday February 27th.  After you send the referral, will you follow up in a week and try to facilitate getting the patient an appointment please, preferably with Dr. Linus Mako? Also please call patient's wife and let her know the plan as stated above.   Thank you both.

## 2015-09-23 NOTE — Telephone Encounter (Signed)
Called and spoke to patient order has been sent to [facility] Per patient's wife Dr. Linus Mako referral in process.

## 2015-09-23 NOTE — Telephone Encounter (Signed)
Placed referral to [facility] for in home PT as request by Dr Jaynee Eagles.

## 2015-10-04 ENCOUNTER — Telehealth: Payer: Self-pay | Admitting: *Deleted

## 2015-10-04 DIAGNOSIS — K632 Fistula of intestine: Secondary | ICD-10-CM

## 2015-10-04 NOTE — Telephone Encounter (Signed)
-----   Message from Loralie Champagne, PA-C sent at 10/01/2015  4:32 PM EST ----- Please contact patient's wife to schedule CT enterography to rule out colovesical fistula.  They live in [place] and would prefer to have it done in [place] if they do them there.  She asked me to make you aware that he already has appts on March 17th and March 30th so to avoid those dates.  Thank you,  Jess

## 2015-10-04 NOTE — Telephone Encounter (Signed)
Spoke with [facility] Lawrence radiology(Judy) and scheduled CT enterography on 10/12/15 at 10:15 AM with 9:00 AM arrival. NPO for food after midnight. May have clear liquids. Patient's wife notified of date, time and instructions.

## 2015-10-12 ENCOUNTER — Telehealth: Payer: Self-pay | Admitting: Neurology

## 2015-10-12 ENCOUNTER — Ambulatory Visit (HOSPITAL_COMMUNITY)
Admission: RE | Admit: 2015-10-12 | Discharge: 2015-10-12 | Disposition: A | Discharge status: Home / Self Care | Payer: Medicare PPO | Source: Ambulatory Visit | Attending: Gastroenterology | Admitting: Gastroenterology

## 2015-10-12 DIAGNOSIS — I7090 Unspecified atherosclerosis: Secondary | ICD-10-CM | POA: Diagnosis not present

## 2015-10-12 DIAGNOSIS — N200 Calculus of kidney: Secondary | ICD-10-CM | POA: Insufficient documentation

## 2015-10-12 DIAGNOSIS — K632 Fistula of intestine: Secondary | ICD-10-CM | POA: Diagnosis not present

## 2015-10-12 DIAGNOSIS — M47816 Spondylosis without myelopathy or radiculopathy, lumbar region: Secondary | ICD-10-CM | POA: Insufficient documentation

## 2015-10-12 DIAGNOSIS — M5136 Other intervertebral disc degeneration, lumbar region: Secondary | ICD-10-CM | POA: Diagnosis not present

## 2015-10-12 DIAGNOSIS — N329 Bladder disorder, unspecified: Secondary | ICD-10-CM | POA: Insufficient documentation

## 2015-10-12 DIAGNOSIS — K760 Fatty (change of) liver, not elsewhere classified: Secondary | ICD-10-CM | POA: Diagnosis not present

## 2015-10-12 LAB — POCT I-STAT CREATININE: CREATININE: 0.8 mg/dL (ref 0.61–1.24)

## 2015-10-12 MED ORDER — IOHEXOL 300 MG/ML  SOLN
125.0000 mL | Freq: Once | INTRAMUSCULAR | Status: AC | PRN
  Administered 2015-10-12: 125 mL via INTRAVENOUS

## 2015-10-12 MED ORDER — BARIUM SULFATE 0.1 % PO SUSP
ORAL | Status: AC
  Filled 2015-10-12: qty 3

## 2015-10-12 NOTE — Telephone Encounter (Signed)
Deneane with [facility] is calling to request an order for PT @[facility] @[phone] [phone] for patient. Thanks!

## 2015-10-12 NOTE — Telephone Encounter (Signed)
Dr Jaynee Eagles- ok to give verbal order for PT?

## 2015-10-13 NOTE — Telephone Encounter (Signed)
Frederick Lawrence called back from [facility]. She stated they never received referral for PT. Advised we faxed back on 09/23/15 with notes. She stated they never got this. Advised I will have referral coordinator refax. She is going to call tomorrow to verify they received fax.

## 2015-10-13 NOTE — Telephone Encounter (Signed)
Returned call from [facility]. LVM for her to call back

## 2015-10-13 NOTE — Telephone Encounter (Signed)
That's fine. thanks

## 2015-10-14 NOTE — Telephone Encounter (Signed)
Re faxed order  To [facility] [phone]. x2

## 2015-10-19 ENCOUNTER — Encounter: Payer: Self-pay | Admitting: *Deleted

## 2015-10-19 NOTE — Progress Notes (Signed)
Faxed completed form from [facility] stating pt to eval pt on Monday 3/27. Received confirmation. Fax: [phone]. Sent copy to medical records.

## 2015-10-25 ENCOUNTER — Other Ambulatory Visit: Payer: Self-pay | Admitting: Urology

## 2015-10-25 ENCOUNTER — Telehealth: Payer: Self-pay | Admitting: Neurology

## 2015-10-25 DIAGNOSIS — G20 Parkinson's disease: Secondary | ICD-10-CM

## 2015-10-25 DIAGNOSIS — F0390 Unspecified dementia without behavioral disturbance: Secondary | ICD-10-CM

## 2015-10-25 DIAGNOSIS — IMO0002 Reserved for concepts with insufficient information to code with codable children: Secondary | ICD-10-CM

## 2015-10-25 DIAGNOSIS — R229 Localized swelling, mass and lump, unspecified: Principal | ICD-10-CM

## 2015-10-25 NOTE — Telephone Encounter (Signed)
Called wife, Francesca Jewett back. Advised Dr Jaynee Eagles still seeing pt for today. Will give her the message that she called and would like to discuss with her about the visit she had with Dr Linus Mako. She stated he mentioned that he would like to take pt off of Mirapex and would have to tritrate him down. Advised we will call her back. She verbalized understanding and appreciation.

## 2015-10-25 NOTE — Telephone Encounter (Signed)
Spouse called requesting to speak with Dr. Jaynee Eagles regarding medications, states patient saw Dr. Linus Mako (Dr. Jaynee Eagles referred), spouse states they haven't heard anything from Dr. Jaynee Eagles since last visit. Please call [phone].

## 2015-10-26 ENCOUNTER — Encounter: Payer: Self-pay | Admitting: *Deleted

## 2015-10-26 ENCOUNTER — Other Ambulatory Visit: Payer: Self-pay | Admitting: Neurology

## 2015-10-26 MED ORDER — MEMANTINE HCL-DONEPEZIL HCL ER 28-10 MG PO CP24: 1.0000 | ORAL_CAPSULE | Freq: Every day | ORAL | Status: DC

## 2015-10-26 NOTE — Progress Notes (Signed)
Faxed signed POC back to [facility] dated3/27/17. Fax: [phone]. Received confirmation. Sent copy to MR.

## 2015-10-26 NOTE — Telephone Encounter (Signed)
Spoke to patient. She can decrease the mirapex by 1mg  daily every week. So will slowly titrate off over 3 weeks. Terrence Dupont, would you place a referral for hospice for patient please? Thank you.

## 2015-10-27 ENCOUNTER — Other Ambulatory Visit: Payer: Self-pay | Admitting: Radiology

## 2015-10-27 NOTE — Addendum Note (Signed)
Addended by: Hope Pigeon on: 10/27/2015 08:36 AM   Modules accepted: Orders

## 2015-10-27 NOTE — Telephone Encounter (Signed)
Placed referral to hospice per Dr Jaynee Eagles request.

## 2015-10-28 ENCOUNTER — Encounter (HOSPITAL_COMMUNITY): Payer: Self-pay

## 2015-10-28 ENCOUNTER — Encounter (HOSPITAL_COMMUNITY)
Admission: RE | Admit: 2015-10-28 | Discharge: 2015-10-28 | Disposition: A | Discharge status: Home / Self Care | Payer: Medicare PPO | Source: Ambulatory Visit | Attending: Urology | Admitting: Urology

## 2015-10-28 ENCOUNTER — Ambulatory Visit (HOSPITAL_COMMUNITY)
Admission: RE | Admit: 2015-10-28 | Discharge: 2015-10-28 | Disposition: A | Discharge status: Home / Self Care | Payer: Medicare PPO | Source: Ambulatory Visit | Attending: Urology | Admitting: Urology

## 2015-10-28 DIAGNOSIS — J449 Chronic obstructive pulmonary disease, unspecified: Secondary | ICD-10-CM | POA: Diagnosis not present

## 2015-10-28 DIAGNOSIS — Z7982 Long term (current) use of aspirin: Secondary | ICD-10-CM | POA: Insufficient documentation

## 2015-10-28 DIAGNOSIS — G3183 Dementia with Lewy bodies: Secondary | ICD-10-CM | POA: Insufficient documentation

## 2015-10-28 DIAGNOSIS — E039 Hypothyroidism, unspecified: Secondary | ICD-10-CM | POA: Insufficient documentation

## 2015-10-28 DIAGNOSIS — R229 Localized swelling, mass and lump, unspecified: Secondary | ICD-10-CM | POA: Insufficient documentation

## 2015-10-28 DIAGNOSIS — G20 Parkinson's disease: Secondary | ICD-10-CM | POA: Insufficient documentation

## 2015-10-28 DIAGNOSIS — Z8551 Personal history of malignant neoplasm of bladder: Secondary | ICD-10-CM | POA: Diagnosis not present

## 2015-10-28 DIAGNOSIS — N398 Other specified disorders of urinary system: Secondary | ICD-10-CM | POA: Insufficient documentation

## 2015-10-28 DIAGNOSIS — N132 Hydronephrosis with renal and ureteral calculous obstruction: Secondary | ICD-10-CM | POA: Insufficient documentation

## 2015-10-28 DIAGNOSIS — Z87891 Personal history of nicotine dependence: Secondary | ICD-10-CM | POA: Diagnosis not present

## 2015-10-28 DIAGNOSIS — E785 Hyperlipidemia, unspecified: Secondary | ICD-10-CM | POA: Insufficient documentation

## 2015-10-28 DIAGNOSIS — I10 Essential (primary) hypertension: Secondary | ICD-10-CM | POA: Diagnosis not present

## 2015-10-28 DIAGNOSIS — Z79899 Other long term (current) drug therapy: Secondary | ICD-10-CM | POA: Insufficient documentation

## 2015-10-28 DIAGNOSIS — E119 Type 2 diabetes mellitus without complications: Secondary | ICD-10-CM | POA: Diagnosis not present

## 2015-10-28 DIAGNOSIS — Z7984 Long term (current) use of oral hypoglycemic drugs: Secondary | ICD-10-CM | POA: Diagnosis not present

## 2015-10-28 DIAGNOSIS — IMO0002 Reserved for concepts with insufficient information to code with codable children: Secondary | ICD-10-CM

## 2015-10-28 LAB — CBC WITH DIFFERENTIAL/PLATELET
BASOS ABS: 0 10*3/uL (ref 0.0–0.1)
Basophils Relative: 0 %
EOS PCT: 3 %
Eosinophils Absolute: 0.3 10*3/uL (ref 0.0–0.7)
HEMATOCRIT: 42.4 % (ref 39.0–52.0)
Hemoglobin: 14.3 g/dL (ref 13.0–17.0)
LYMPHS PCT: 16 %
Lymphs Abs: 1.6 10*3/uL (ref 0.7–4.0)
MCH: 31.9 pg (ref 26.0–34.0)
MCHC: 33.7 g/dL (ref 30.0–36.0)
MCV: 94.6 fL (ref 78.0–100.0)
MONO ABS: 0.6 10*3/uL (ref 0.1–1.0)
MONOS PCT: 6 %
Neutro Abs: 7.3 10*3/uL (ref 1.7–7.7)
Neutrophils Relative %: 75 %
PLATELETS: 294 10*3/uL (ref 150–400)
RBC: 4.48 MIL/uL (ref 4.22–5.81)
RDW: 13.7 % (ref 11.5–15.5)
WBC: 9.8 10*3/uL (ref 4.0–10.5)

## 2015-10-28 LAB — BASIC METABOLIC PANEL
Anion gap: 8 (ref 5–15)
BUN: 19 mg/dL (ref 6–20)
CHLORIDE: 105 mmol/L (ref 101–111)
CO2: 27 mmol/L (ref 22–32)
CREATININE: 1.01 mg/dL (ref 0.61–1.24)
Calcium: 9.3 mg/dL (ref 8.9–10.3)
GFR calc Af Amer: >60 mL/min (ref >60)
GFR calc non Af Amer: >60 mL/min (ref >60)
Glucose, Bld: 105 mg/dL — ABNORMAL HIGH (ref 65–99)
POTASSIUM: 4.4 mmol/L (ref 3.5–5.1)
Sodium: 140 mmol/L (ref 135–145)

## 2015-10-28 LAB — PROTIME-INR
INR: 1.08 (ref 0.00–1.49)
Prothrombin Time: 13.8 seconds (ref 11.6–15.2)

## 2015-10-28 MED ORDER — MIDAZOLAM HCL 2 MG/2ML IJ SOLN
INTRAMUSCULAR | Status: AC
  Filled 2015-10-28: qty 4

## 2015-10-28 MED ORDER — MIDAZOLAM HCL 2 MG/2ML IJ SOLN
INTRAMUSCULAR | Status: AC | PRN
  Administered 2015-10-28: 0.5 mg via INTRAVENOUS

## 2015-10-28 MED ORDER — FENTANYL CITRATE (PF) 100 MCG/2ML IJ SOLN
INTRAMUSCULAR | Status: AC
  Filled 2015-10-28: qty 2

## 2015-10-28 MED ORDER — SODIUM CHLORIDE 0.9 % IV SOLN
INTRAVENOUS | Status: DC
  Administered 2015-10-28: 10:00:00 via INTRAVENOUS

## 2015-10-28 MED ORDER — FENTANYL CITRATE (PF) 100 MCG/2ML IJ SOLN
INTRAMUSCULAR | Status: AC | PRN
  Administered 2015-10-28: 25 ug via INTRAVENOUS

## 2015-10-28 NOTE — Discharge Instructions (Signed)
Needle Biopsy, Care After °These instructions give you information about caring for yourself after your procedure. Your doctor may also give you more specific instructions. Call your doctor if you have any problems or questions after your procedure. °HOME CARE °· Rest as told by your doctor. °· Take medicines only as told by your doctor. °· There are many different ways to close and cover the biopsy site, including stitches (sutures), skin glue, and adhesive strips. Follow instructions from your doctor about: °¨ How to take care of your biopsy site. °¨ When and how you should change your bandage (dressing). °¨ When you should remove your dressing. °¨ Removing whatever was used to close your biopsy site. °· Check your biopsy site every day for signs of infection. Watch for: °¨ Redness, swelling, or pain. °¨ Fluid, blood, or pus. °GET HELP IF: °· You have a fever. °· You have redness, swelling, or pain at the biopsy site, and it lasts longer than a few days. °· You have fluid, blood, or pus coming from the biopsy site. °· You feel sick to your stomach (nauseous). °· You throw up (vomit). °GET HELP RIGHT AWAY IF: °· You are short of breath. °· You have trouble breathing. °· Your chest hurts. °· You feel dizzy or you pass out (faint). °· You have bleeding that does not stop with pressure or a bandage. °· You cough up blood. °· Your belly (abdomen) hurts. °  °This information is not intended to replace advice given to you by your health care provider. Make sure you discuss any questions you have with your health care provider. °  °Document Released: 06/22/2008 Document Revised: 11/24/2014 Document Reviewed: 07/06/2014 °Elsevier Interactive Patient Education ©2016 [employer] °Moderate Conscious Sedation, Adult, Care After °Refer to this sheet in the next few weeks. These instructions provide you with information on caring for yourself after your procedure. Your health care provider may also give you more specific  instructions. Your treatment has been planned according to current medical practices, but problems sometimes occur. Call your health care provider if you have any problems or questions after your procedure. °WHAT TO EXPECT AFTER THE PROCEDURE  °After your procedure: °· You may feel sleepy, clumsy, and have poor balance for several hours. °· Vomiting may occur if you eat too soon after the procedure. °HOME CARE INSTRUCTIONS °· Do not participate in any activities where you could become injured for at least 24 hours. Do not: °· Drive. °· Swim. °· Ride a bicycle. °· Operate heavy machinery. °· Cook. °· Use power tools. °· Climb ladders. °· Work from a high place. °· Do not make important decisions or sign legal documents until you are improved. °· If you vomit, drink water, juice, or soup when you can drink without vomiting. Make sure you have little or no nausea before eating solid foods. °· Only take over-the-counter or prescription medicines for pain, discomfort, or fever as directed by your health care provider. °· Make sure you and your family fully understand everything about the medicines given to you, including what side effects may occur. °· You should not drink alcohol, take sleeping pills, or take medicines that cause drowsiness for at least 24 hours. °· If you smoke, do not smoke without supervision. °· If you are feeling better, you may resume normal activities 24 hours after you were sedated. °· Keep all appointments with your health care provider. °SEEK MEDICAL CARE IF: °· Your skin is pale or bluish in color. °· You   continue to feel nauseous or vomit. °· Your pain is getting worse and is not helped by medicine. °· You have bleeding or swelling. °· You are still sleepy or feeling clumsy after 24 hours. °SEEK IMMEDIATE MEDICAL CARE IF: °· You develop a rash. °· You have difficulty breathing. °· You develop any type of allergic problem. °· You have a fever. °MAKE SURE YOU: °· Understand these  instructions. °· Will watch your condition. °· Will get help right away if you are not doing well or get worse. °  °This information is not intended to replace advice given to you by your health care provider. Make sure you discuss any questions you have with your health care provider. °  °Document Released: 04/30/2013 Document Revised: 07/31/2014 Document Reviewed: 04/30/2013 °Elsevier Interactive Patient Education ©2016 [employer]. ° °

## 2015-10-28 NOTE — Procedures (Signed)
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

## 2015-10-28 NOTE — Sedation Documentation (Signed)
Patient denies pain and is resting comfortably.  

## 2015-10-28 NOTE — Consult Note (Signed)
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 male with past medical history significant for hypertension, hyperlipidemia, diabetes, parkinsonism, Lewy body dementia, COPD, hypothyroidism. He has recently been evaluated for gross hematuria and urinary incontinence with lesion at dome of bladder found on CT scan. Posterior wall and bladder dome biopsies performed on 08/06/15 were negative for malignancy. Additional CT scan performed on 10/12/15 revealed calcified fatty structure along the anterior external margin of the bladder between the bladder and the pubis in addition to wall thickening anteriorly along the urinary bladder. Also noted was a large stone in the right renal collecting system causing partial obstruction of the right UPJ with moderate hydronephrosis. 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  Past Medical History  Diagnosis Date  . Hypertension   . Hyperlipidemia   . Type 2 diabetes mellitus ([place])   . Lesion of bladder   . History of bladder cancer   . Parkinsonism ([place]) neurolotist-  dr Rexene Alberts ([facility])    unspecified type  . Lewy body dementia   . Head cold     no fever  . Uses wheelchair     w/ little assist can stand and pivot to transfer  . Total urinary incontinence     depends  . Heat rash   . At high risk for falls   . Arthritis     Past Surgical History  Procedure Laterality Date  . Transurethral resection of bladder tumor  2012  &   03/ 2016  . Cardiovascular stress test  07-05-2015   (done at [facility] hospital)    per pt wife and PCP note -- normal w/ no ischemia lexiscan nuclear study  . Cataract extraction w/ intraocular lens  implant, bilateral  2013  . Cystoscopy with biopsy N/A 08/06/2015    Procedure:  CYSTOSCOPY WITH BIOPSY AND FULGERATION;  Surgeon: Cleon Gustin, MD;  Location: [facility];  Service: Urology;  Laterality: N/A;  1 HR NEEDS GYRUS [phone] [id number]  . Cystoscopy w/ retrogrades Bilateral 08/06/2015    Procedure: CYSTOSCOPY WITH RETROGRADE PYELOGRAM;  Surgeon: Cleon Gustin, MD;  Location: [facility];  Service: Urology;  Laterality: Bilateral;    Allergies: Review of patient's allergies indicates no known allergies.  Medications: Prior to Admission medications   Medication Sig Start Date End Date Taking? Authorizing Provider  carbidopa-levodopa (SINEMET CR) 50-200 MG tablet Take 1 tablet by mouth at bedtime. 08/31/15  Yes Melvenia Beam, MD  carbidopa-levodopa (SINEMET) 25-100 MG tablet Take 2 tablets by mouth 4 (four) times daily. 05/20/15  Yes Melvenia Beam, MD  Cholecalciferol (VITAMIN D PO) Take 1 Dose by mouth daily. Gummy 08/12/15  Yes Historical Provider, MD  citalopram (CELEXA) 40 MG tablet Take 1 tablet (40 mg total) by mouth daily. 04/01/15  Yes Melvenia Beam, MD  glipiZIDE (GLUCOTROL XL) 5 MG 24 hr tablet Take 5 mg by mouth daily. 09/17/15  Yes Historical Provider, MD  lisinopril (PRINIVIL,ZESTRIL) 20 MG tablet Take 20 mg by mouth every morning.    Yes Historical Provider, MD  Memantine HCl-Donepezil HCl (NAMZARIC) 28-10 MG CP24 Take 1 tablet by mouth daily. 10/26/15  Yes Melvenia Beam, MD  metFORMIN (GLUCOPHAGE) 1000 MG tablet Take 1,000 mg by mouth 2 (two) times  daily with a meal.   Yes Historical Provider, MD  Multiple Vitamins-Minerals (MULTIVITAMIN ADULT PO) Take 1 tablet by mouth daily. 07/14/15  Yes Historical Provider, MD  pravastatin (PRAVACHOL) 20 MG tablet Take 20 mg by mouth every evening.    Yes Historical Provider, MD  tamsulosin (FLOMAX) 0.4 MG CAPS capsule Take 0.4 mg by mouth 2 (two) times daily.   Yes Historical Provider, MD  ACCU-CHEK AVIVA PLUS test strip  07/12/15   Historical Provider, MD    ACCU-CHEK SOFTCLIX LANCETS lancets  07/12/15   Historical Provider, MD  albuterol (PROVENTIL) (2.5 MG/3ML) 0.083% nebulizer solution Take 2.5 mg by nebulization every 6 (six) hours as needed for wheezing or shortness of breath.    Historical Provider, MD  aspirin 81 MG tablet Take 81 mg by mouth daily. Reported on 09/01/2015    Historical Provider, MD  Dextromethorphan-Guaifenesin [facility] DM) 30-600 MG TB12 Take 1 tablet by mouth 2 (two) times daily as needed (cough/cold symptoms).     Historical Provider, MD  nystatin cream (MYCOSTATIN) Apply 1 application topically 3 (three) times daily as needed for dry skin (on genitals due to incontinence).  02/25/15   Historical Provider, MD     Family History  Problem Relation Age of Onset  . Heart failure Mother   . Heart failure Father   . Parkinsonism Neg Hx   . Colon cancer Neg Hx     Social History   Social History  . Marital Status: Married    Spouse Name: Francesca Jewett   . Number of Children: 3  . Years of Education: N/A   Social History Main Topics  . Smoking status: Former Smoker -- 1.00 packs/day for 60 years    Types: Cigarettes    Quit date: 01/10/2015  . Smokeless tobacco: Never Used     Comment: quit [id number]  . Alcohol Use: No  . Drug Use: No  . Sexual Activity: Not Asked   Other Topics Concern  . None   Social History Narrative   Lives at home with wife, Francesca Jewett   Caffeine use: 1 cup coffee/morning       Review of Systems  Constitutional: Negative for fever and chills.  Respiratory: Positive for cough. Negative for shortness of breath.   Cardiovascular: Negative for chest pain.  Gastrointestinal: Negative for nausea, vomiting, abdominal pain and blood in stool.  Genitourinary: Negative for dysuria.       Recent hematuria; urinary incontinence  Musculoskeletal: Negative for back pain.  Neurological: Negative for headaches.       Hx dementia    Vital Signs: Blood pressure 158/80, heart rate 63, respirations 18,  O2 sat 98% room air   Physical Exam patient awake, responds to questions. Family in room. Chest clear to auscultation bilaterally. Heart with regular rate and rhythm. Abdomen soft, positive bowel sounds, nontender. Lower extremities with trace to 1+ edema.  Mallampati Score:     Imaging: Ct Entero Abd/pelvis W/cm  10/12/2015  CLINICAL DATA:  Colonic fistula. Bladder lesions. transurethral resection of the bladder. Dementia. EXAM: CT ABDOMEN AND PELVIS WITH CONTRAST (ENTEROGRAPHY) TECHNIQUE: Multidetector CT of the abdomen and pelvis during bolus administration of intravenous contrast. Negative oral contrast VoLumen was given. CONTRAST:  132mL OMNIPAQUE IOHEXOL 300 MG/ML  SOLN COMPARISON:  None. FINDINGS: Lower chest:  Unremarkable Hepatobiliary: Diffuse hepatic steatosis.  Gallbladder unremarkable. Pancreas: Punctate calcifications along the uncinate process without soft tissue mass, probably from remote inflammation. Spleen: Unremarkable Adrenals/Urinary Tract: 1.8 by 1.4 by 1.1 cm right  collecting system calculus extending into the UPJ with associated surrounding collecting system wall thickening, abnormal stranding/edema in the adjacent fatty tissues, and moderate right hydronephrosis. There is mild proximal right hydroureter with enhancement of the ureter wall near the stone. On delayed images, some contrast does make its way pass the stone and into the right ureter Mild bladder wall thickening anteriorly. Fatty density anteriorly in the urinary bladder lumen, with postoperative findings anteriorly along the urinary bladder with faint calcification, query bladder suspension or unusual fatty tumor. Vascular calcification in the left renal hilum. 1.1 cm hypodense lesion of the left kidney lower pole has inferior density on image 61 series 5, and is likely a complex cyst. Stomach/Bowel: 3.6 cm diverticulum of the transverse duodenum, without inflammatory signs. Appendix normal. Expected fold pattern in  the jejunum and ileum, without wall thickening or significant abnormal mucosal enhancement. Terminal ileum unremarkable. If there is a fistula involving the anus or rectum it is not well shown. Vascular/Lymphatic: Aortoiliac atherosclerotic vascular disease. No pathologic adenopathy. Reproductive: Prostate gland 3.4 by 5.1 cm, there is some mild but nonspecific enhancement in the right peripheral zone apex. Other: No supplemental non-categorized findings. Musculoskeletal: Lumbar spondylosis and degenerative disc disease with considerable loss of disc height and vacuum disc phenomenon at all levels between L2 and S1. IMPRESSION: 1. I do not see a colovesical fistula. Cystogram may provide a higher sensitivity for small fistulous due to the positive pressure. 2. The bladder is abnormal. There is a rim calcified fatty structure along the anterior external margin of the urinary bladder between the bladder and the pubis. In addition there is wall thickening anteriorly in the urinary bladder in what appears to be a 1.3 by 0.7 by 1.4 cm fat density anteriorly along the urinary bladder luminal side. I am uncertain whether this represents seem also fine fat forming a fat fluid layer, or a small solid fatty lesion along the lumen. In either case this is an unusual lesion. This could be postoperative or related to the calcified fatty structure along the external margin of the anterior urinary bladder. Occasionally, fatty mass can occur in the bladder wall in the setting of tuberous sclerosis. 3. There is a large stone in the right collecting system causing partial obstruction of the right UPJ, with moderate right hydronephrosis, and considerable surrounding edema in the adipose tissues as well as inflammatory accentuated enhancement of the ureteral wall. However, contrast does make its way pass this stone on the delayed images. 4. Diffuse hepatic steatosis. 5.  Aortoiliac atherosclerotic vascular disease. 6. There is mild but  nonspecific enhancement in the peripheral zone of the right prostate apex. Correlate with PSA levels. 7. Lower lumbar spondylosis and degenerative disc disease. Electronically Signed   By: Van Clines M.D.   On: 10/12/2015 11:54    Labs:  CBC:  Recent Labs  08/06/15 1257 10/28/15 0930  WBC  --  9.8  HGB 16.3 14.3  HCT 48.0 42.4  PLT  --  294    COAGS:  Recent Labs  10/28/15 0930  INR 1.08    BMP:  Recent Labs  08/06/15 1257 10/12/15 0921 10/28/15 0930  NA 142  --  140  K 4.2  --  4.4  CL 101  --  105  CO2  --   --  27  GLUCOSE 112*  --  105*  BUN 18  --  19  CALCIUM  --   --  9.3  CREATININE 0.90 0.80 1.01  GFRNONAA  --   --  >  60  GFRAA  --   --  >60    LIVER FUNCTION TESTS: No results for input(s): BILITOT, AST, ALT, ALKPHOS, PROT, ALBUMIN in the last 8760 hours.  TUMOR MARKERS: No results for input(s): AFPTM, CEA, CA199, CHROMGRNA in the last 8760 hours.  Assessment and Plan: 82 y.o. male with past medical history significant for hypertension, hyperlipidemia, diabetes, parkinsonism, Lewy body dementia, COPD, hypothyroidism. He has recently been evaluated for gross hematuria and urinary incontinence with lesion at dome of bladder found on CT scan. Posterior wall and bladder dome biopsies performed on 08/06/15 were negative for malignancy. Additional CT scan performed on 10/12/15 revealed calcified fatty structure along the anterior external margin of the bladder between the bladder and the pubis in addition to wall thickening anteriorly along the urinary bladder. Also noted was a large stone in the right renal collecting system causing partial obstruction of the right UPJ with moderate hydronephrosis. 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 Consent signed and in chart.      Thank you for this interesting consult.  I greatly enjoyed meeting Stockton Pullian and look forward to participating in their care.  A copy of this report was sent to the requesting provider on this date.  Electronically Signed: D. Rowe Robert 10/28/2015, 11:04 AM   I spent a total of 20 minutes in face to face in clinical consultation, greater than 50% of which was counseling/coordinating care for CT-guided biopsy of urachal mass

## 2015-10-31 ENCOUNTER — Other Ambulatory Visit: Payer: Self-pay | Admitting: Neurology

## 2015-12-31 ENCOUNTER — Other Ambulatory Visit: Payer: Self-pay | Admitting: Neurology

## 2016-01-04 NOTE — Telephone Encounter (Signed)
Dr Jaynee Eagles- Did you still want him to take this dose at night? No mention of it in last OV note. Also, he does not have a f/u scheduled d/t you wanting to see pt after [facility] Please advise!

## 2016-01-07 NOTE — Telephone Encounter (Signed)
Yes, that is fine he can still take it. He does not need a follow up with me thanks

## 2016-04-24 ENCOUNTER — Telehealth: Payer: Self-pay | Admitting: Neurology

## 2016-04-24 NOTE — Telephone Encounter (Signed)
Dr Jaynee Eagles- [facility] back. Per Dr Jaynee Eagles, he should be evaluated by PCP to evaluate for other causes of symptoms such as a UTI. She stated he last saw PCP on September 11th and he had UA completed. She states his urine is strong smelling. She states sx have gotten worse within the last few weeks/ She is going to contact PCP to get this completed. She will have them forward results to [place]. Advised her to call back and we are happy to schedule f/u if needed. She verbalized understanding.

## 2016-04-24 NOTE — Telephone Encounter (Signed)
Pt's wife called said he is "not with it". Can't stay on task, movements are more stiff and slow. Dr A first f/u is 11/14, she is requesting he be seen sooner. Please call

## 2016-06-06 ENCOUNTER — Encounter: Payer: Self-pay | Admitting: Neurology

## 2016-06-06 ENCOUNTER — Ambulatory Visit (INDEPENDENT_AMBULATORY_CARE_PROVIDER_SITE_OTHER): Payer: Medicare PPO | Admitting: Neurology

## 2016-06-06 VITALS — BP 130/76 | HR 69 | Temp 97.3°F

## 2016-06-06 DIAGNOSIS — R1310 Dysphagia, unspecified: Secondary | ICD-10-CM | POA: Diagnosis not present

## 2016-06-06 DIAGNOSIS — F0280 Dementia in other diseases classified elsewhere without behavioral disturbance: Secondary | ICD-10-CM

## 2016-06-06 DIAGNOSIS — G3183 Dementia with Lewy bodies: Secondary | ICD-10-CM | POA: Diagnosis not present

## 2016-06-06 MED ORDER — MEMANTINE HCL 10 MG PO TABS: 10.0000 mg | ORAL_TABLET | Freq: Two times a day (BID) | ORAL | 5 refills | Status: AC

## 2016-06-06 MED ORDER — CARBIDOPA-LEVODOPA ER 50-200 MG PO TBCR: 1.0000 | EXTENDED_RELEASE_TABLET | Freq: Every day | ORAL | 5 refills | Status: AC

## 2016-06-06 MED ORDER — CITALOPRAM HYDROBROMIDE 20 MG PO TABS: 20.0000 mg | ORAL_TABLET | Freq: Every day | ORAL | 12 refills | Status: DC

## 2016-06-06 MED ORDER — CARBIDOPA-LEVODOPA 25-100 MG PO TABS: 2.0000 | ORAL_TABLET | Freq: Four times a day (QID) | ORAL | 5 refills | Status: AC

## 2016-06-06 MED ORDER — DONEPEZIL HCL 10 MG PO TABS: 10.0000 mg | ORAL_TABLET | Freq: Every day | ORAL | 5 refills | Status: AC

## 2016-06-06 NOTE — Patient Instructions (Signed)
Remember to drink plenty of fluid, eat healthy meals and do not skip any meals. Try to eat protein with a every meal and eat a healthy snack such as fruit or nuts in between meals. Try to keep a regular sleep-wake schedule and try to exercise daily, particularly in the form of walking, 20-30 minutes a day, if you can.   As far as your medications are concerned, I would like to suggest: Stop namzeric and instead Aricept once daily and namenda twice daily  As far as diagnostic testing: Swallow study, hospice referral, physical therapy  I would like to see you back in 6 onths, sooner if we need to. Please call us with any interim questions, concerns, problems, updates or refill requests.   Our phone number is [phone]. We also have an after hours call service for urgent matters and there is a physician on-call for urgent questions. For any emergencies you know to call 911 or go to the nearest emergency room

## 2016-06-06 NOTE — Progress Notes (Signed)
[facility]     Provider:  Dr Jaynee Eagles Referring Provider: Curlene Labrum, MD Primary Care Physician:  Curlene Labrum, MD  CC: Parkinsonism likely diffuse lewy body dementia  HPI: Shamik Chowdhry is a 82 y.o. male here as a referral from Dr. Pleas Koch for Lewy Body Dementia. PMHx diabetes and HLD, acute hypoxic respiratory failure, pneumonia, COPD, hypothroidism, DM.   Interval history 06/07/2016: Patient for follow up of Lewy Body Dementia. He can't walk. Mobility is worsening. There was physical therapy in the home. It was not making a difference and they stopped. Discussed Hospice referral.  Swallowing is worsening he drinks through a straw. He has difficulty swallowing but no choking. Almost like he is forgetting how to swallow. No problem eating. Namzeric is too expensive will change to namenda twice a day and aricept once a day. He sleeps well, he eats well. Mobility worsening, can;t walk anymore is wheelchair bound, memory continues to decline. He has vivid dreams at night, does not act out of his dreams, occasionally with talk out. He is totally dependent for all ADLs and IADLs. No dyskinesias.   09/20/2015: He was diagnosed with Lewy Body dementia and Parkinson's disorder 4 years ago. The symptoms started about 5 years ago. He began to stumble. They went to Dr. Linton Rump. He was told he has a neurologic disorder with some parkinsons like symptoms. He was previously seen by a neurologist in [facility]. In April 2013 he was diagnosed with Lewy body dementia. He was started on Aricept and Sinemet as well as Mirapex. His wife does not report any REM behavior disorder. He does not have any prominent hallucinations. His wife feels that he has had visual hallucinations less than a handful of times. Sometimes he has vivid dreams and seems to talk about his dreams a lot in the morning. He does not have recurrent falls but this is primarily because they have been very  careful at home. He has a tendency to fall backwards. He takes Mirapex and Carbidopa/levodopa. Doesn't feel like it has helped. He has had memory loss for the past 2-3 years.  They initially noticed he didn't know what the day was and didn't care. He knows his birthdate.  He noticed the motor symptoms before he noticed the memory changes.  His voice is getting softer. No drooling or wet pillows. He is incontinent. No constipation. His is slowing down, shuffling. Can't pick the feet up. No loss of smell or taste. Loves to eat. In the last 6 months, he doesn't want to communicate. He will answer questions but rarely speaks spontaneously. Handwriting is getting smaller. No difficukly swallowing currently. Not lightheaded. He does not feel the medication kicking in. He "runs down" in the afternoon before the last dose. He feels he is more stable now, not getting worse.  Right hand tremor mostly at rest and when eating. Having freezing episodes. Will take the medication at 8am, then at 1 and then at 8pm. He has been on the carbidopa and mirapex for at least 2 years.    He has been on Celexa for mood disorder.  In the past, laboratory studies included heavy-metal screen which was unremarkable, B12 level which was normal. 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 He had mild sensory greater than motor neuropathy.  Interval update 09/20/2015: He was doing better with physical therapy. He has declined  as far as mobility. There is no walking at all. He can't get up on his own. Has to help with transfers. And before he was transferring better. No falls. Patient says he feels fine. No pain. No side effects from the current medications. Discussed getting a second opinion from a movement disorder specialty clinic. Wife would like a second opinion on parkinsonism and possible causes such as PSP. Will  request physical therapy in home with [facility].   MRi of the brain:  IMPRESSION:  1. Moderate atrophy and diffuse white matter disease bilaterally. This likely reflects the sequela of chronic microvascular ischemia.  2. Despite the atrophy common the ventricles are somewhat prominent. This raises possibility of normal pressure hydrocephalus is well.  3. Resolution at the cerebral peduncle nonspecific for Parkinson's disease.    Review of Systems: Patient complains of symptoms per HPI as well as the following symptoms: easy bruising, hearing loss, urination problems, incontinence, memory loss, confusion, tremor, depression, too much sleep, disinterets in activities. . Pertinent negatives per HPI. All others negative.  Social History   Social History  . Marital status: Married    Spouse name: Francesca Jewett   . Number of children: 3  . Years of education: N/A   Occupational History  . Not on file.   Social History Main Topics  . Smoking status: Former Smoker    Packs/day: 1.00    Years: 60.00    Types: Cigarettes    Quit date: 01/10/2015  . Smokeless tobacco: Never Used     Comment: quit [id number]  . Alcohol use No  . Drug use: No  . Sexual activity: Not on file   Other Topics Concern  . Not on file   Social History Narrative   Lives at home with wife, Francesca Jewett   Caffeine use: 1 cup coffee/morning     Family History  Problem Relation Age of Onset  . Heart failure Mother   . Heart failure Father   . Parkinsonism Neg Hx   . Colon cancer Neg Hx     Past Medical History:  Diagnosis Date  . Arthritis   . At high risk for falls   . Head cold    no fever  . Heat rash   . History of bladder cancer   . Hyperlipidemia   . Hypertension   . Lesion of bladder   . Lewy body dementia   . Parkinsonism ([place]) neurolotist-  dr Rexene Alberts ([facility])   unspecified type  . Total urinary incontinence    depends  . Type 2 diabetes mellitus ([place])   .  Uses wheelchair    w/ little assist can stand and pivot to transfer    Past Surgical History:  Procedure Laterality Date  . CARDIOVASCULAR STRESS TEST  07-05-2015   (done at [facility] hospital)   per pt wife and PCP note -- normal w/ no ischemia lexiscan nuclear study  . CATARACT EXTRACTION W/ INTRAOCULAR LENS  IMPLANT, BILATERAL  2013  . CYSTOSCOPY W/ RETROGRADES Bilateral 08/06/2015   Procedure: CYSTOSCOPY WITH RETROGRADE PYELOGRAM;  Surgeon: Cleon Gustin, MD;  Location: [facility];  Service: Urology;  Laterality: Bilateral;  . CYSTOSCOPY WITH BIOPSY N/A 08/06/2015   Procedure: CYSTOSCOPY WITH BIOPSY AND FULGERATION;  Surgeon: Cleon Gustin, MD;  Location: [facility];  Service: Urology;  Laterality: N/A;  1 HR NEEDS GYRUS [phone] [id number]  . TRANSURETHRAL RESECTION OF BLADDER TUMOR  2012  &   03/ 2016  Current Outpatient Prescriptions  Medication Sig Dispense Refill  . ACCU-CHEK AVIVA PLUS test strip     . ACCU-CHEK SOFTCLIX LANCETS lancets     . albuterol (PROVENTIL) (2.5 MG/3ML) 0.083% nebulizer solution Take 2.5 mg by nebulization every 6 (six) hours as needed for wheezing or shortness of breath.    Marland Kitchen aspirin 81 MG tablet Take 81 mg by mouth daily. Reported on 09/01/2015    . carbidopa-levodopa (SINEMET CR) 50-200 MG tablet TAKE 1 TABLET BY MOUTH DAILY AT BEDTIME 30 tablet 1  . carbidopa-levodopa (SINEMET) 25-100 MG tablet Take 2 tablets by mouth 4 (four) times daily. 720 tablet 3  . Cholecalciferol (VITAMIN D PO) Take 1 Dose by mouth daily. Gummy    . citalopram (CELEXA) 40 MG tablet Take 1 tablet (40 mg total) by mouth daily. 30 tablet 6  . Dextromethorphan-Guaifenesin (MUCINEX DM) 30-600 MG TB12 Take 1 tablet by mouth 2 (two) times daily as needed (cough/cold symptoms).     . furosemide (LASIX) 20 MG tablet Take 10 mg by mouth. Takes 1/2 tablet of 20mg  for swelling in legs    . glipiZIDE (GLUCOTROL XL) 5 MG 24 hr tablet Take  5 mg by mouth daily.    Marland Kitchen lisinopril (PRINIVIL,ZESTRIL) 10 MG tablet Take 10 mg by mouth daily.    . Memantine HCl-Donepezil HCl (NAMZARIC) 28-10 MG CP24 Take 1 tablet by mouth daily. 90 capsule 4  . metFORMIN (GLUCOPHAGE) 1000 MG tablet Take 1,000 mg by mouth 2 (two) times daily with a meal.    . Multiple Vitamins-Minerals (MULTIVITAMIN ADULT PO) Take 1 tablet by mouth daily.    Marland Kitchen nystatin cream (MYCOSTATIN) Apply 1 application topically 3 (three) times daily as needed for dry skin (on genitals due to incontinence).   1  . pravastatin (PRAVACHOL) 20 MG tablet Take 20 mg by mouth every evening.     . tamsulosin (FLOMAX) 0.4 MG CAPS capsule Take 0.4 mg by mouth 2 (two) times daily.     No current facility-administered medications for this visit.     Allergies as of 06/06/2016  . (No Known Allergies)    Vitals: BP 130/76 (BP Location: Left Arm, Patient Position: Sitting, Cuff Size: Normal)   Pulse 69   Temp 97.3 F (36.3 C) (Oral)  Last Weight:  Wt Readings from Last 1 Encounters:  10/28/15 200 lb (90.7 kg)   Last Height:   Ht Readings from Last 1 Encounters:  09/20/15 5' 9.5" (1.765 m)    Speech: Hypophonic. Bradyphrenia.  Cranial Nerves: Hypomimia.   The pupils are equal, round, and reactive to light. Visual fields are full to threat. Significantly impaired vertical eye movements. Poor tracking. Nuchal rigidity. Decreased blink rate with facial masking. Trigeminal sensation is intact and the muscles of mastication are normal. The face is symmetric. The palate elevates in the midline. Hearing intact. Voice is normal. Shoulder shrug is normal. The tongue has normal motion without fasciculations.   Coordination:  Bradykinetic finger taps, severely impaired fine motor skills  Gait: Cannot stand unsupported, limited exam but appears to have short strides, magnetic gait, enbloc turning, decreased arm swing. Tendency to fall backwards.    Motor Observation:  Mild  right resting tremor  Tone: upper extremity rigidity with right cogwheeling > left arm cogwheeling   Posture:  Posture is slightly stooped   Strength: Mild hip flexion weaknes otherwise appears intact strength is V/V in the upper and lower limbs.    Sensation: intact to LT  Reflexes, attempted,  paratonia  Toes downgoing  +frontal release signs     Assessment/Plan: In summary, Rutledge Dillion is a very pleasant 82 y.o.-year old male with an underlying medical history of COPD, hypothyroidism, diabetes, memory loss, hyperlipidemia, history of pneumonia, who started having abnormal gait and posture about 3-1/2 to 4 years ago. He has had memory loss for about 3+ years. He had a brain MRI without contrast on 03/24/2015 which showed atrophy, significant white matter changes, and ventriculomegaly. He likely has diffuse Lewy Body dementia with possible contributions from vascular parkinsonism/ dementia. Worsening functionality can no longer walk.   - Hospice referral - Will send referral to [facility] for swallow syudy  - Will request records (see below) - he was on Carbidopa/Levodopa 50/200 ER tid prescribed previously - unsure why he was on the ER instead of IR. Changed to 25/100 IR and increase frequency to qid every 4 hours.-Try to separate Sinemet from food (especially protein-rich foods like meat, dairy, eggs) by about 30-60 mins - this will help the absorption of the medication. If you have some nausea with the medication, you can take it with some light food like crackers or ginger ale. He is doing well on this dosage without side effects. - Continue the Aricept to 10mg  daily, he was on this previously - SInemet ER qhs - Continue Namenda   Addendum: Notes from [facility].   MRI of the lumbar spine performed in February 2013: Shows mild to moderate lumbar spondylosis, borderline to mild central stenosis at L3-L4, multilevel facet arthropathy,  small central disc protrusion at L1-L2.  EMG nerve conduction study in February 2013: Summary mild sensory greater than motor neuropathy. Needle EMG of the left lower extremity suggests chronic left L5 greater than S1 and L4 radiculopathy.  Labs include normal lead, normal methylmalonic acid, normal arsenic normal mercury normal cadmium, [id number], normal folic acid.  he was first evaluated in January 2013 for stooping over and losing balance was started in the summer of 2012. He described aching in the back, stooping, shuffling at the feet at time. Occasionally some trembling in the hands. Falls over forwards. Worsening. Gradual onset and progressive. No incontinence. Some memory changes. Emotional instability and cries and laughs easily. Exam was significant for decreased sensation distally in the feet, slow shuffling gait which is slightly wide based. Diagnosed with diabetic neuropathy and symptoms of lumbar stenosis, symptoms of pseudobulbar affect, Lewy body disease. Had a long discussion with family in April 2013 with diagnosis of Lewy body disease. Aricept was started. Sinemet was started 25-100 by mouth twice a day. At a later appointment Sinemet was increased and low-dose Celexa was also added. At a later appointment Sinemet extended release 50-200 was substituted for Sinemet instant release, 1 by mouth twice a day. (Unclear why this was changed). Next appointment in October 2014 his Sinemet was changed to CR 50-200 milligram tablet one by mouth 3 times a day. In December 2014 Mirapex was added and subsequently increase at next appointments. Lenox P[place] was started in July 2015 while patient was still on Aricept 5 mg which doesn't appears that was ever increased to 10. Namenda was stopped because per notes neurologist did not feel as though it helped.   EEG in February 2013 was normal awake and drowsy.  Patient was evaluated at [facility] by Dr. Linus Mako: Examination shows severe apraxia, magnetic  grasp reflex, thought it was 1981, president was [employer], could not do months backwards, could not hold a conversation for long,  he was in an on medication state, eye movements were full, no nystagmus, limitation in upgaze, had saccadic intrusions, no limitations in down gaze, no facial weakness, stiffness, no gross motor deficit, monotone, moderately impaired speech, abnormal diminution of the facial expression, slight bilateral action/postural tremor bilaterally, marketed rigidity of the neck but full range of motion, rigidity right upper extremity marketed by full range of motion, rigidity left upper extremity severe range of motion achieved with difficulty, rigidity of the bilateral lower extremities were marketed but full range of motion easily achieved. Finger taps right hand severely impaired with frequent hesitation initiating movements are rests and ongoing movement, finger taps on the left were similar, moderately impaired grip in the right hand and severely impaired on the left with hesitation initiating movements, unable to arise without help, severely stooped posture with kyphosis, cannot walk at all even with assistance, unable to stand without assistance, body bradykinesia and hypokinesia with Parkinson Lois, poverty or sharp amplitude movement. Total score was 63  Impression: He does not have NPH or PSP, he has atypical parkinsonism and given his cognitive symptoms starting around the same time as his parkinsonism, severe dementia now within 5 years of symptom onset, some level of fluctuations in cognition, this is more consistent with diffusely body dementia rather than Parkinson's disease. One compounding factor is his extensive encephalomalacia on brain MRI and that can represent a superimposed vascular dementia and vascular parkinsonism. He is not getting a robust improvement in his motor symptoms with levodopa and Mirapex. Given his severe dementia, recommendations would be to gradually  taper off his Mirapex completely over the next 1-1-1/2 months. If there is clear motor decline, Sinemet can be increased to compensate hopefully this will improve his confusion. He also recommended that given his severe dementia and lack of mobility that he may be a candidate for hospice.  Sarina Ill, MD  [facility] [address] [place], [place] [address]  Phone [phone] Fax [phone]  A total of 40 minutes was spent face-to-face with this patient. Over half this time was spent on counseling patient on the Parlrkinsonism diagnosis and different diagnostic and therapeutic options available.

## 2016-09-29 DIAGNOSIS — E039 Hypothyroidism, unspecified: Secondary | ICD-10-CM | POA: Diagnosis not present

## 2016-09-29 DIAGNOSIS — E119 Type 2 diabetes mellitus without complications: Secondary | ICD-10-CM | POA: Diagnosis not present

## 2016-09-29 DIAGNOSIS — G3183 Dementia with Lewy bodies: Secondary | ICD-10-CM | POA: Diagnosis not present

## 2016-12-04 ENCOUNTER — Ambulatory Visit: Payer: Medicare PPO | Admitting: Neurology

## 2017-02-14 DIAGNOSIS — Z7401 Bed confinement status: Secondary | ICD-10-CM | POA: Diagnosis not present

## 2017-02-14 DIAGNOSIS — R279 Unspecified lack of coordination: Secondary | ICD-10-CM | POA: Diagnosis not present

## 2017-02-21 DIAGNOSIS — Z7401 Bed confinement status: Secondary | ICD-10-CM | POA: Diagnosis not present

## 2017-02-21 DIAGNOSIS — R279 Unspecified lack of coordination: Secondary | ICD-10-CM | POA: Diagnosis not present

## 2017-04-23 DEATH — deceased

## 2017-04-25 IMAGING — MR MR HEAD W/O CM
9 of 10 series · 37 of 48 positions shown · non-contrast
Comparison: None.

CLINICAL DATA: Parkinson's disease. Progressive difficulty with
walking. Personal history of bladder cancer.

EXAM:
MRI HEAD WITHOUT CONTRAST
TECHNIQUE: Multiplanar, multiecho pulse sequences of the brain and surrounding
structures were obtained without intravenous contrast.

[Series 3: [id number] · sagittal · 5.0mm · 0.45mm/px · 1 of 20 slices shown]
[im 1/20]
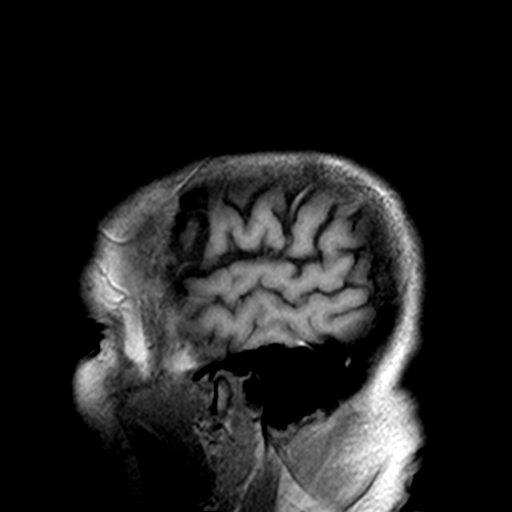

[Series 6: T2 · axial · 5.0mm · 0.75mm/px · z∈[-123,+37]mm · 3 of 26 slices shown (1 of 2)]
[im 1/26]
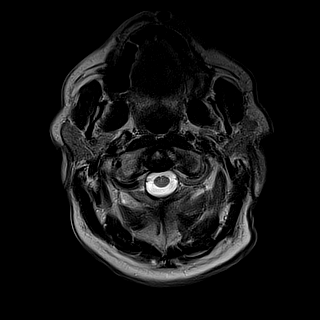
[im 13/26]
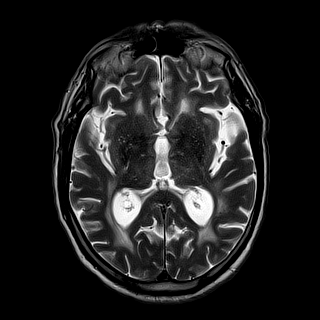
[im 26/26]
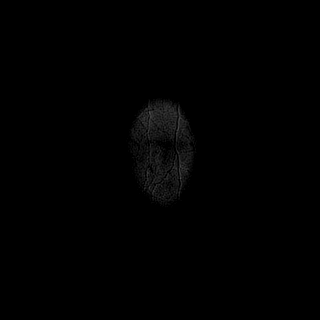

[Series 7: FLAIR · axial · 5.0mm · 0.94mm/px · z∈[-123,+37]mm · 3 of 26 slices shown]
[im 1/26]
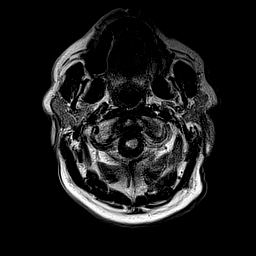
[im 13/26]
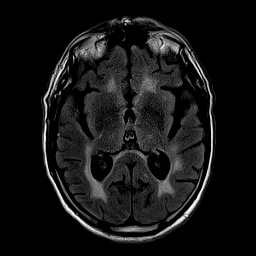
[im 26/26]
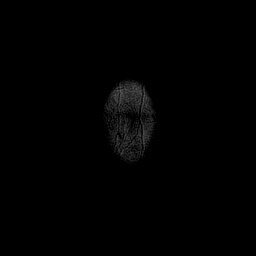

[Series 8: T1 · axial · 2.0mm · 0.47mm/px · z∈[-136,+32]mm · 9 of 86 slices shown]
[im 1/86]
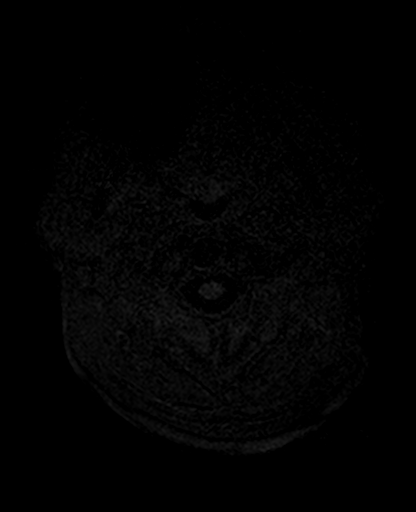
[im 11/86]
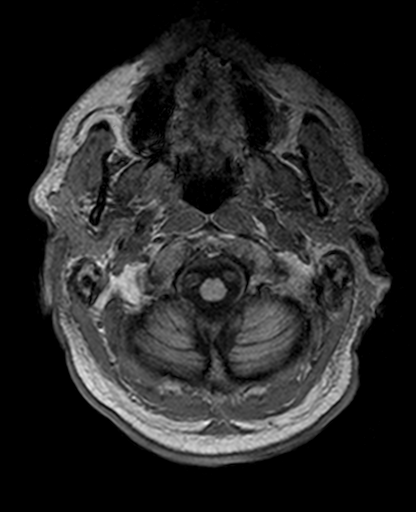
[im 22/86]
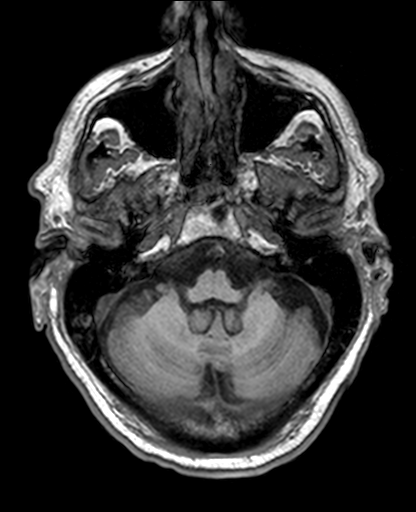
[im 32/86]
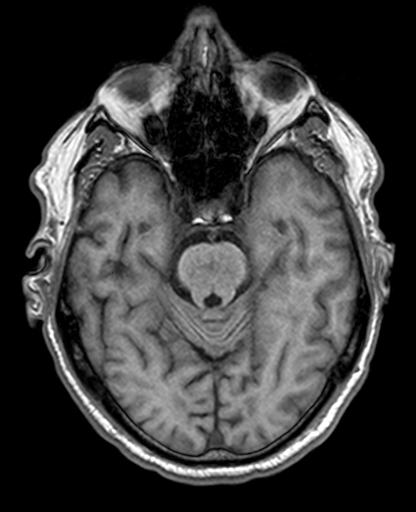
[im 43/86]
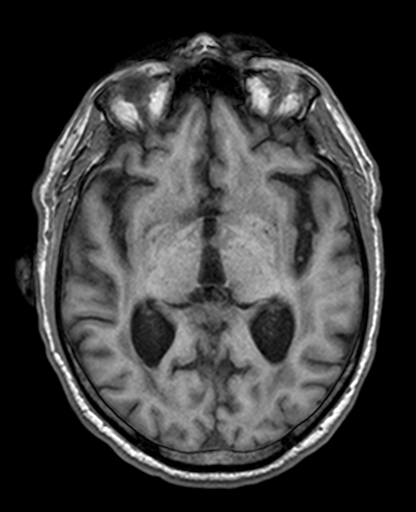
[im 54/86]
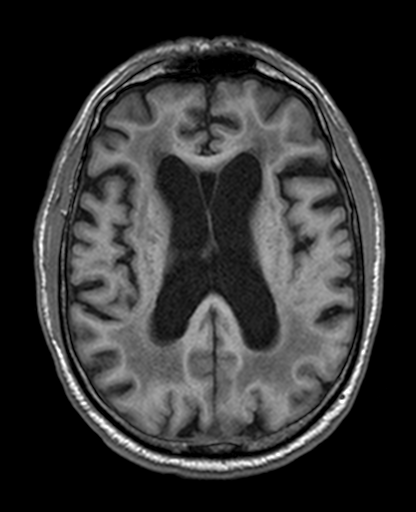
[im 64/86]
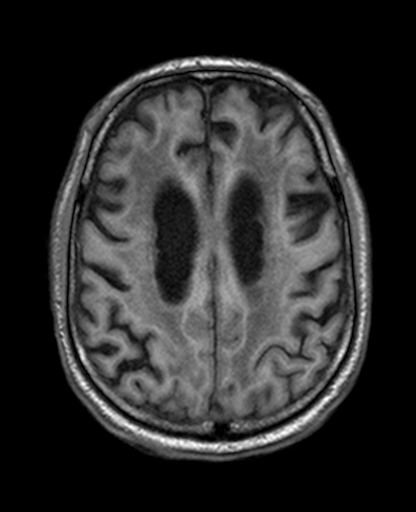
[im 75/86]
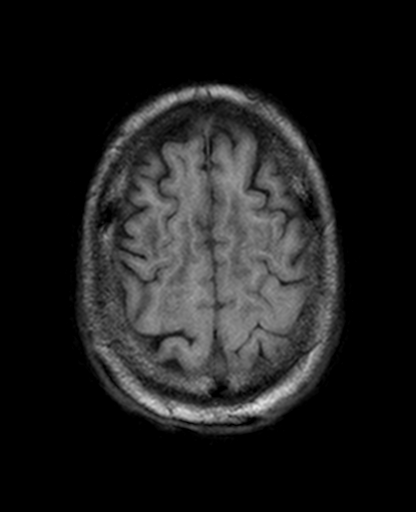
[im 86/86]
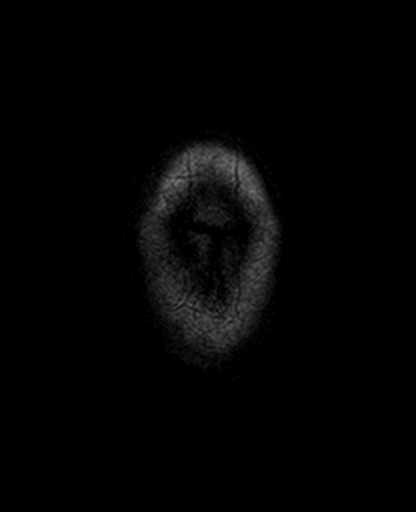

[Series 9: trauma axial · axial · 5.0mm · 0.45mm/px · z∈[-124,+37]mm · 3 of 26 slices shown]
[im 1/26]
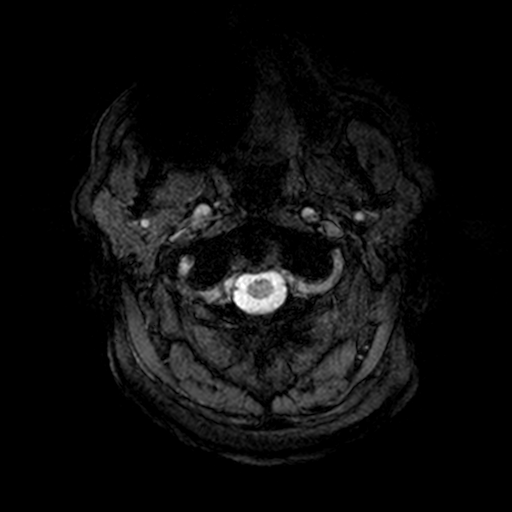
[im 13/26]
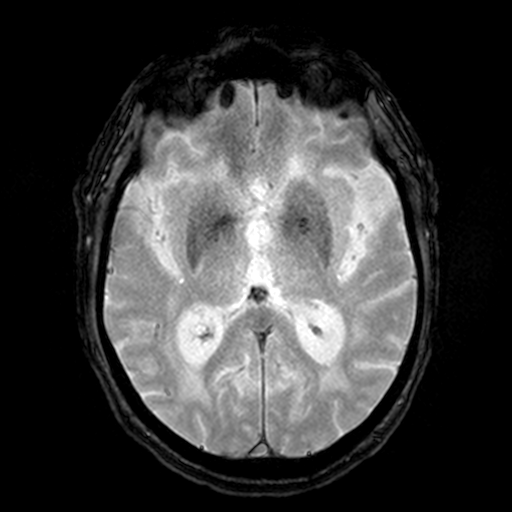
[im 26/26]
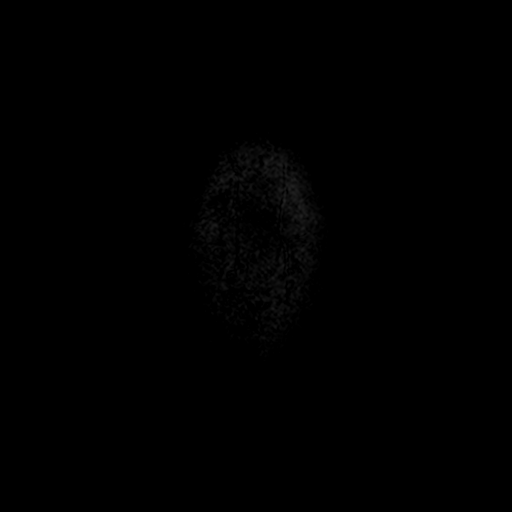

[Series 10: T2 · coronal · 5.0mm · 0.71mm/px · 3 of 28 slices shown (2 of 2)]
[im 1/28]
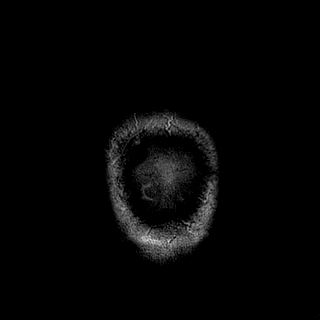
[im 14/28]
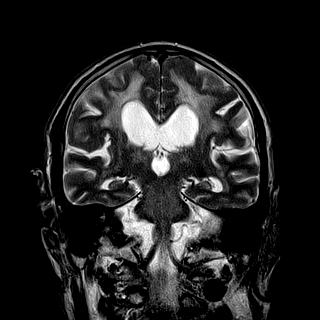
[im 28/28]
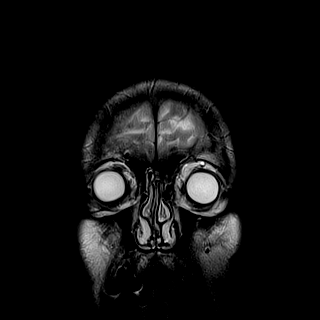

[Series 100: <mpr thick range> · axial · 3.0mm · 0.82mm/px · z∈[-125,+36]mm · 6 of 55 slices shown]
[im 1/55]
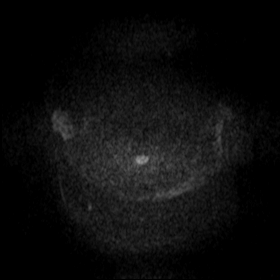
[im 11/55]
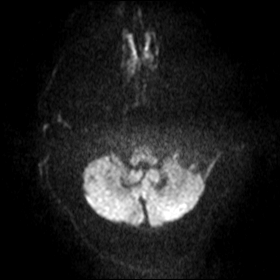
[im 22/55]
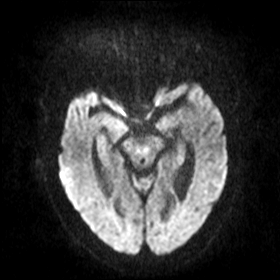
[im 33/55]
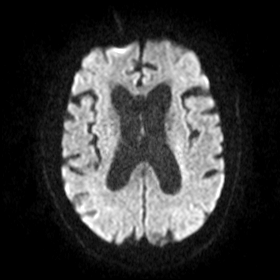
[im 44/55]
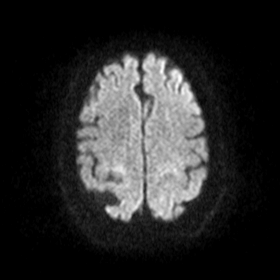
[im 55/55]
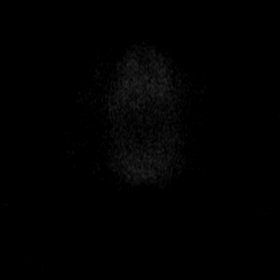

[Series 104: <mpr thick range(4)> · axial · 3.0mm · 0.82mm/px · z∈[-138,+25]mm · 6 of 55 slices shown]
[im 1/55]
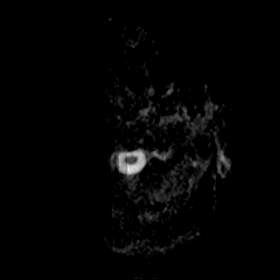
[im 11/55]
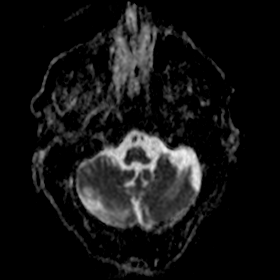
[im 22/55]
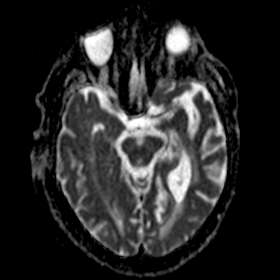
[im 33/55]
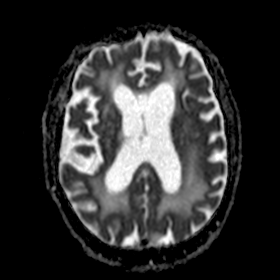
[im 44/55]
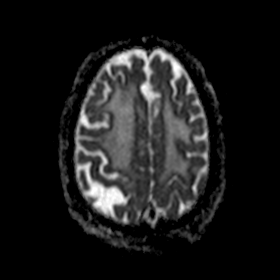
[im 55/55]
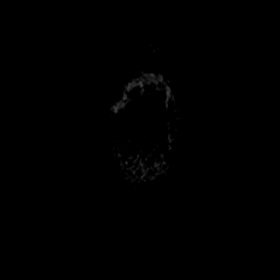

[Series 106: <mpr thick range(6)> · coronal · 3.0mm · 0.62mm/px · 3 of 67 slices shown]
[im 1/67]
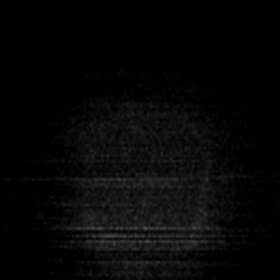
[im 12/67]
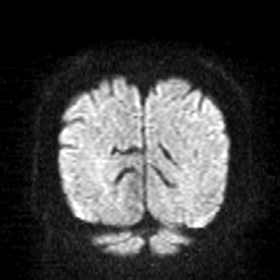
[im 23/67]
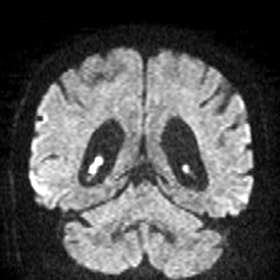

[37 of 48 positions shown; findings below may reference images not displayed]

FINDINGS: Resolution at the cerebral peduncles is nonspecific for Parkinson's
disease. Moderate generalized atrophy is present. Extensive
periventricular and subcortical T2 hyperintensities are evident
bilaterally. White matter changes extend through the thalami into
the brainstem.

The ventricles are mildly prominent despite the atrophy. The a
persistent cavum septum pellucidum is noted. No significant
extra-axial fluid collection is present.

Flow is present in the major intracranial arteries. Bilateral lens
replacements are noted. The globes and orbits are otherwise intact.
The paranasal sinuses and mastoid air cells are clear.

The skullbase is within normal limits. Midline structures are
unremarkable.
IMPRESSION: 1. Moderate atrophy and diffuse white matter disease bilaterally.
This likely reflects the sequela of chronic microvascular ischemia.
2. Despite the atrophy common the ventricles are somewhat prominent.
This raises possibility of normal pressure hydrocephalus is well.
3. Resolution at the cerebral peduncle nonspecific for Parkinson's
disease.

## 2017-07-07 ENCOUNTER — Other Ambulatory Visit: Payer: Self-pay | Admitting: Neurology

## 2017-07-09 ENCOUNTER — Telehealth: Payer: Self-pay | Admitting: *Deleted

## 2017-07-09 NOTE — Telephone Encounter (Signed)
Refill of Celexa given for one month. Pt cancelled appt in May d/t being under hospice care and not being able to get out. Note sent to pharmacy w/ refill asking for future refills through hospice if pt remains under their care.

## 2017-07-10 MED ORDER — CITALOPRAM HYDROBROMIDE 20 MG PO TABS: 20.0000 mg | ORAL_TABLET | Freq: Every day | ORAL | 11 refills | Status: AC

## 2017-07-10 NOTE — Telephone Encounter (Signed)
Refill of Celexa ordered for one year.

## 2017-07-10 NOTE — Addendum Note (Signed)
Addended by: Gildardo Griffes on: 07/10/2017 09:21 AM   Modules accepted: Orders

## 2017-07-10 NOTE — Telephone Encounter (Signed)
I'm happy to refill this script for a year thanks
